# Patient Record
Sex: Female | Born: 1955
Health system: Southern US, Community
[De-identification: ages and names within clinical notes are randomized; demographics above are authoritative.]

## PROBLEM LIST (undated history)

## (undated) DIAGNOSIS — I10 Essential (primary) hypertension: Secondary | ICD-10-CM

## (undated) DIAGNOSIS — I2510 Atherosclerotic heart disease of native coronary artery without angina pectoris: Secondary | ICD-10-CM

## (undated) DIAGNOSIS — K759 Inflammatory liver disease, unspecified: Secondary | ICD-10-CM

## (undated) DIAGNOSIS — E039 Hypothyroidism, unspecified: Secondary | ICD-10-CM

## (undated) DIAGNOSIS — Z87442 Personal history of urinary calculi: Secondary | ICD-10-CM

## (undated) DIAGNOSIS — E785 Hyperlipidemia, unspecified: Secondary | ICD-10-CM

## (undated) DIAGNOSIS — M1990 Unspecified osteoarthritis, unspecified site: Secondary | ICD-10-CM

## (undated) HISTORY — DX: Essential (primary) hypertension: I10

## (undated) HISTORY — PX: TONSILLECTOMY: SUR1361

## (undated) HISTORY — DX: Hyperlipidemia, unspecified: E78.5

## (undated) HISTORY — DX: Atherosclerotic heart disease of native coronary artery without angina pectoris: I25.10

## (undated) HISTORY — PX: CARDIAC CATHETERIZATION: SHX172

## (undated) HISTORY — DX: Hypothyroidism, unspecified: E03.9

---

## 2011-05-26 ENCOUNTER — Encounter: Payer: Self-pay | Admitting: Cardiology

## 2011-06-02 ENCOUNTER — Encounter: Payer: Self-pay | Admitting: Cardiology

## 2011-06-26 ENCOUNTER — Encounter: Payer: Self-pay | Admitting: Cardiology

## 2011-07-07 ENCOUNTER — Encounter: Payer: Self-pay | Admitting: Cardiology

## 2011-07-11 ENCOUNTER — Encounter: Payer: Self-pay | Admitting: Cardiology

## 2012-02-19 ENCOUNTER — Encounter: Payer: Self-pay | Admitting: Cardiology

## 2012-02-19 ENCOUNTER — Encounter: Payer: Self-pay | Admitting: *Deleted

## 2012-02-20 ENCOUNTER — Encounter: Payer: Self-pay | Admitting: Cardiology

## 2012-02-20 ENCOUNTER — Ambulatory Visit (INDEPENDENT_AMBULATORY_CARE_PROVIDER_SITE_OTHER): Payer: PRIVATE HEALTH INSURANCE | Admitting: Cardiology

## 2012-02-20 VITALS — BP 137/70 | HR 87 | Ht 66.0 in | Wt 175.0 lb

## 2012-02-20 DIAGNOSIS — I2510 Atherosclerotic heart disease of native coronary artery without angina pectoris: Secondary | ICD-10-CM

## 2012-02-20 DIAGNOSIS — I10 Essential (primary) hypertension: Secondary | ICD-10-CM

## 2012-02-20 DIAGNOSIS — E785 Hyperlipidemia, unspecified: Secondary | ICD-10-CM

## 2012-02-20 DIAGNOSIS — I429 Cardiomyopathy, unspecified: Secondary | ICD-10-CM | POA: Insufficient documentation

## 2012-02-20 DIAGNOSIS — I428 Other cardiomyopathies: Secondary | ICD-10-CM

## 2012-02-20 MED ORDER — CARVEDILOL 6.25 MG PO TABS: 6.2500 mg | ORAL_TABLET | Freq: Two times a day (BID) | ORAL | Status: DC

## 2012-02-20 NOTE — Assessment & Plan Note (Signed)
Continue statin. Check lipids and liver. 

## 2012-02-20 NOTE — Assessment & Plan Note (Signed)
Continue present blood pressure medications. Check potassium and renal function. 

## 2012-02-20 NOTE — Assessment & Plan Note (Signed)
Continue aspirin and statin. 

## 2012-02-20 NOTE — Patient Instructions (Signed)
Your physician recommends that you schedule a follow-up appointment in: 3 MONTHS  Your physician recommends that you return for a FASTING lipid profile: WHEN ABLE AFTER 8:30AM  INCREASE CARVEDILOL TO 6.25 MG TWICE DAILY

## 2012-02-20 NOTE — Progress Notes (Signed)
HPI: 56 year old female for evaluation of coronary disease and cardiomyopathy. Patient recently moved here from [place]. Last fall she apparently developed palpitations with exertion. A nuclear study in August of 2012 showed a fixed anterior defect most likely related to breast artifact. Her ejection fraction was 42%. A Holter monitor showed no arrhythmia. An echocardiogram showed an ejection fraction of 35%, mild to moderate mitral regurgitation and mild left ventricular enlargement. Cardiac CTA in September of 2012 showed an ejection fraction of 45%, 80% LAD proximally, 80% mid LAD, 70% first diagonal, 70% ramus intermedius. Calcium score was 48. Cardiac catheterization was performed in October of 2012. Ejection fraction was 45% and there was a 60% ramus intermedius. There was a 50% proximal LAD. There was a 30% first diagonal. Fractional flow reserve on the LAD lesion was 0.92 and 0.91 on a ramus intermedius. Patient has not seen a physician since moving here. She denies dyspnea on exertion, orthopnea, PND, pedal edema, palpitations, syncope or exertional chest pain.  Current Outpatient Prescriptions  Medication Sig Dispense Refill  . ALPRAZolam (XANAX) 0.25 MG tablet Take 0.25 mg by mouth as needed.      Marland Kitchen aspirin 81 MG tablet Take 81 mg by mouth daily.      . carvedilol (COREG) 6.25 MG tablet Take 1 tablet (6.25 mg total) by mouth 2 (two) times daily with a meal.  60 tablet  12  . CRESTOR 10 MG tablet Take 10 mg by mouth daily.       . Iron-Folic Acid-B12-C-Docusate (FERRAPLUS 90 PO) Take 1 tablet by mouth 3 x daily with food.      Marland Kitchen levothyroxine (SYNTHROID, LEVOTHROID) 25 MCG tablet Take 25 mcg by mouth daily.       Marland Kitchen lisinopril (PRINIVIL,ZESTRIL) 5 MG tablet Take 2.5 mg by mouth daily.       Marland Kitchen spironolactone (ALDACTONE) 25 MG tablet Take 25 mg by mouth daily.       . Vitamin D, Ergocalciferol, (DRISDOL) 50000 UNITS CAPS Take 50,000 Units by mouth 2 (two) times a week.       Marland Kitchen  DISCONTD: carvedilol (COREG) 3.125 MG tablet Take 3.125 mg by mouth 2 (two) times daily with a meal.         Not on File  Past Medical History  Diagnosis Date  . Cardiomyopathy   . Hypertension   . Hyperlipidemia   . Hypothyroid   . CAD (coronary artery disease)   . Cardiomyopathy     Past Surgical History  Procedure Date  . Cardiac catheterization   . Tonsillectomy     History   Social History  . Marital Status: Married    Spouse Name: N/A    Number of Children: 1  . Years of Education: N/A   Occupational History  .      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 Smoking status: Former Smoker -- 0.3 packs/day for 5 years    Types: Cigarettes    Quit date: 02/19/1977  . Smokeless tobacco: Never Used  . Alcohol Use: Yes     Occasional  . Drug Use: Not on file  . Sexually Active: Not on file   Other Topics Concern  . Not on file   Social History Narrative  . No narrative on file    Family History  Problem Relation Age of Onset  . Hypertension Mother   . Heart disease Father     CHF  ROS:  no fevers or chills, productive cough, hemoptysis, dysphasia, odynophagia, melena, hematochezia, dysuria, hematuria, rash, seizure activity, orthopnea, PND, pedal edema, claudication. Remaining systems are negative.  Physical Exam:   Blood pressure 137/70, pulse 87, height 5\' 6"  (1.676 m), weight 79.379 kg (175 lb).  General:  Well developed/well nourished in NAD Skin warm/dry Patient not depressed No peripheral clubbing Back-normal HEENT-normal/normal eyelids Neck supple/normal carotid upstroke bilaterally; no bruits; no JVD; no thyromegaly chest - CTA/ normal expansion CV - RRR/normal S1 and S2; no murmurs, rubs or gallops;  PMI nondisplaced Abdomen -NT/ND, no HSM, no mass, + bowel sounds, no bruit 2+ femoral pulses, no bruits Ext-no edema, chords, 2+ DP Neuro-grossly nonfocal  ECG normal sinus rhythm at a rate of 93. Axis  normal. Left atrial enlargement. Prolonged QT interval. Nonspecific T-wave changes.

## 2012-02-20 NOTE — Assessment & Plan Note (Signed)
Patient has a nonischemic cardiomyopathy. Plan continue ACE inhibitor and beta blocker. Increase carvedilol to 6.25 mg by mouth twice a day. Titrate medications as tolerated. Repeat echocardiogram.

## 2012-02-29 ENCOUNTER — Other Ambulatory Visit (INDEPENDENT_AMBULATORY_CARE_PROVIDER_SITE_OTHER): Payer: PRIVATE HEALTH INSURANCE

## 2012-02-29 ENCOUNTER — Encounter: Payer: Self-pay | Admitting: *Deleted

## 2012-02-29 DIAGNOSIS — I2510 Atherosclerotic heart disease of native coronary artery without angina pectoris: Secondary | ICD-10-CM

## 2012-02-29 LAB — HEPATIC FUNCTION PANEL
ALT: 17 U/L (ref 0–35)
AST: 22 U/L (ref 0–37)
Alkaline Phosphatase: 65 U/L (ref 39–117)
Total Bilirubin: 0.8 mg/dL (ref 0.3–1.2)

## 2012-02-29 LAB — LIPID PANEL
HDL: 50.7 mg/dL (ref >39.00)
Total CHOL/HDL Ratio: 3
Triglycerides: 139 mg/dL (ref 0.0–149.0)

## 2012-04-10 ENCOUNTER — Telehealth: Payer: Self-pay | Admitting: Cardiology

## 2012-04-10 MED ORDER — SPIRONOLACTONE 25 MG PO TABS: 25.0000 mg | ORAL_TABLET | Freq: Every day | ORAL | Status: DC

## 2012-04-10 NOTE — Telephone Encounter (Signed)
New Problem:    Called needing a refill of her spironolactone (ALDACTONE) 25 MG tablet at the pharmacy listed on her profile (Phone [phone]).  Please call back once the order has been placed and feel free to leave a voicemail.

## 2012-04-12 ENCOUNTER — Other Ambulatory Visit: Payer: Self-pay | Admitting: Cardiology

## 2012-04-12 MED ORDER — SPIRONOLACTONE 25 MG PO TABS: 25.0000 mg | ORAL_TABLET | Freq: Every day | ORAL | Status: DC

## 2012-05-14 ENCOUNTER — Other Ambulatory Visit: Payer: Self-pay | Admitting: *Deleted

## 2012-05-14 MED ORDER — ROSUVASTATIN CALCIUM 10 MG PO TABS: 10.0000 mg | ORAL_TABLET | Freq: Every day | ORAL | Status: DC

## 2012-05-27 ENCOUNTER — Encounter: Payer: Self-pay | Admitting: Cardiology

## 2012-05-27 ENCOUNTER — Ambulatory Visit (INDEPENDENT_AMBULATORY_CARE_PROVIDER_SITE_OTHER): Payer: PRIVATE HEALTH INSURANCE | Admitting: Cardiology

## 2012-05-27 VITALS — BP 132/88 | HR 75 | Ht 66.0 in | Wt 181.0 lb

## 2012-05-27 DIAGNOSIS — E785 Hyperlipidemia, unspecified: Secondary | ICD-10-CM

## 2012-05-27 DIAGNOSIS — I2510 Atherosclerotic heart disease of native coronary artery without angina pectoris: Secondary | ICD-10-CM

## 2012-05-27 DIAGNOSIS — I10 Essential (primary) hypertension: Secondary | ICD-10-CM

## 2012-05-27 DIAGNOSIS — I429 Cardiomyopathy, unspecified: Secondary | ICD-10-CM

## 2012-05-27 DIAGNOSIS — I428 Other cardiomyopathies: Secondary | ICD-10-CM

## 2012-05-27 LAB — BASIC METABOLIC PANEL
BUN: 14 mg/dL (ref 6–23)
Calcium: 9.7 mg/dL (ref 8.4–10.5)
Creatinine, Ser: 0.8 mg/dL (ref 0.4–1.2)
GFR: 84.17 mL/min (ref >60.00)

## 2012-05-27 LAB — TSH: TSH: 2.81 u[IU]/mL (ref 0.35–5.50)

## 2012-05-27 NOTE — Assessment & Plan Note (Signed)
Continue ACE inhibitor and beta blocker. Check potassium and renal function. Check TSH. Repeat echocardiogram.

## 2012-05-27 NOTE — Assessment & Plan Note (Signed)
Blood pressure controlled. Continue present medications. 

## 2012-05-27 NOTE — Progress Notes (Signed)
HPI: Pleasant female I initially saw in May of 2013 for evaluation of coronary disease and cardiomyopathy. Previous studies in [facility] - nuclear study in 16-Jun-2011 showed a fixed anterior defect most likely related to breast artifact. Her ejection fraction was 42%. A Holter monitor showed no arrhythmia. An echocardiogram showed an ejection fraction of 35%, mild to moderate mitral regurgitation and mild left ventricular enlargement. Cardiac CTA in September of 2012 showed an ejection fraction of 45%, 80% LAD proximally, 80% mid LAD, 70% first diagonal, 70% ramus intermedius. Calcium score was 48. Cardiac catheterization was performed in October of 2012. Ejection fraction was 45% and there was a 60% ramus intermedius. There was a 50% proximal LAD. There was a 30% first diagonal. Fractional flow reserve on the LAD lesion was 0.92 and 0.91 on a ramus intermedius. Since she was last seen, the patient has dyspnea with more extreme activities but not with routine activities. It is relieved with rest. It is not associated with chest pain. There is no orthopnea, PND or pedal edema. There is no syncope or palpitations. There is no exertional chest pain.    Current Outpatient Prescriptions  Medication Sig Dispense Refill  . ALPRAZolam (XANAX) 0.25 MG tablet Take 0.25 mg by mouth as needed.      Marland Kitchen aspirin 81 MG tablet Take 81 mg by mouth daily.      . carvedilol (COREG) 6.25 MG tablet Take 1 tablet (6.25 mg total) by mouth 2 (two) times daily with a meal.  60 tablet  12  . Iron-Folic Acid-B12-C-Docusate (FERRAPLUS 90 PO) Take 1 tablet by mouth 3 x daily with food.      Marland Kitchen levothyroxine (SYNTHROID, LEVOTHROID) 25 MCG tablet Take 25 mcg by mouth daily.       Marland Kitchen lisinopril (PRINIVIL,ZESTRIL) 5 MG tablet Take 2.5 mg by mouth daily.       . rosuvastatin (CRESTOR) 10 MG tablet Take 1 tablet (10 mg total) by mouth daily.  30 tablet  12  . spironolactone (ALDACTONE) 25 MG tablet Take 1 tablet (25 mg total) by mouth  daily.  30 tablet  3  . Vitamin D, Ergocalciferol, (DRISDOL) 50000 UNITS CAPS Take 50,000 Units by mouth 2 (two) times a week.          Past Medical History  Diagnosis Date  . Cardiomyopathy   . Hypertension   . Hyperlipidemia   . Hypothyroid   . CAD (coronary artery disease)   . Cardiomyopathy     Past Surgical History  Procedure Date  . Cardiac catheterization   . Tonsillectomy     History   Social History  . Marital Status: Married    Spouse Name: N/A    Number of Children: 1  . Years of Education: N/A   Occupational History  .      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 Smoking status: Former Smoker -- 0.3 packs/day for 5 years    Types: Cigarettes    Quit date: 02/19/1977  . Smokeless tobacco: Never Used  . Alcohol Use: Yes     Occasional  . Drug Use: Not on file  . Sexually Active: Not on file   Other Topics Concern  . Not on file   Social History Narrative  . No narrative on file    ROS: no fevers or chills, productive cough, hemoptysis, dysphasia, odynophagia, melena, hematochezia, dysuria, hematuria, rash, seizure activity, orthopnea, PND, pedal edema, claudication. Remaining  systems are negative.  Physical Exam: Well-developed well-nourished in no acute distress.  Skin is warm and dry.  HEENT is normal.  Neck is supple.  Chest is clear to auscultation with normal expansion.  Cardiovascular exam is regular rate and rhythm.  Abdominal exam nontender or distended. No masses palpated. Extremities show no edema. neuro grossly intact  ECG normal sinus rhythm at a rate of 75. Nonspecific ST changes.

## 2012-05-27 NOTE — Assessment & Plan Note (Signed)
Continue statin. 

## 2012-05-27 NOTE — Patient Instructions (Addendum)
Your physician recommends that you continue on your current medications as directed. Please refer to the Current Medication list given to you today.  Your physician recommends that you have lab work TODAY:  [facility], [employer] [facility]  Your physician has requested that you have an echocardiogram. Echocardiography is a painless test that uses sound waves to create images of your heart. It provides your doctor with information about the size and shape of your heart and how well your heart's chambers and valves are working. This procedure takes approximately one hour. There are no restrictions for this procedure.   Your physician wants you to follow-up in:1 year you will receive a reminder letter in the mail two months in advance. If you don't receive a letter, please call our office to schedule the follow-up appointment.   You have been referred to gynecology

## 2012-05-27 NOTE — Assessment & Plan Note (Signed)
Continue aspirin and statin. 

## 2012-06-12 ENCOUNTER — Other Ambulatory Visit (HOSPITAL_COMMUNITY): Payer: PRIVATE HEALTH INSURANCE

## 2012-06-14 ENCOUNTER — Other Ambulatory Visit: Payer: Self-pay | Admitting: Cardiology

## 2012-06-14 MED ORDER — LISINOPRIL 5 MG PO TABS: 2.5000 mg | ORAL_TABLET | Freq: Every day | ORAL | Status: DC

## 2012-09-25 ENCOUNTER — Telehealth: Payer: Self-pay | Admitting: Cardiology

## 2012-09-25 NOTE — Telephone Encounter (Signed)
Left message for pt, will need to get vit d from PCP or whomever prescribed.

## 2012-09-25 NOTE — Telephone Encounter (Signed)
plz request refill on vitamin D medication, she can be reached at [phone].  She will be in and out of meetings all day today

## 2012-10-01 ENCOUNTER — Other Ambulatory Visit: Payer: Self-pay

## 2012-10-01 MED ORDER — LEVOTHYROXINE SODIUM 25 MCG PO TABS: 25.0000 ug | ORAL_TABLET | Freq: Every day | ORAL | Status: DC

## 2013-01-07 ENCOUNTER — Telehealth: Payer: Self-pay | Admitting: Cardiology

## 2013-01-07 NOTE — Telephone Encounter (Signed)
Spoke with pt, she reports yesterday she developed a tight and heavy feeling in her chest. She also states she was a little SOB. She feels better today and is under a lot of stress at work. She was doing her desk work when it developed. It was off and on through out the day. After work she walked 1 1/2 miles with no problems. The discomfort did not change with exertion. She has been having some reflux and indigestion type symptoms. She did take tums during the night and it did help relieve the discomfort. Explained to pt that it sounded more like stomach than heart. She will try liquid mylanta and zantac for her symptoms tonight and will discuss with dr Jens Som.

## 2013-01-07 NOTE — Telephone Encounter (Signed)
Follow-up:    Patient returned your call.  Please call back. 

## 2013-01-07 NOTE — Telephone Encounter (Signed)
Left message for pt to call.

## 2013-01-07 NOTE — Telephone Encounter (Signed)
New problem    C/O tightness in chest x 2 days. Sob. Has a Rx for nitro from her previous cardiologist - [facility].

## 2013-01-08 NOTE — Telephone Encounter (Signed)
Left message for pt to call, if feeling the same will need f/u appt

## 2013-01-17 ENCOUNTER — Other Ambulatory Visit: Payer: Self-pay | Admitting: *Deleted

## 2013-01-17 MED ORDER — LISINOPRIL 5 MG PO TABS: 2.5000 mg | ORAL_TABLET | Freq: Every day | ORAL | Status: DC

## 2013-04-18 ENCOUNTER — Other Ambulatory Visit: Payer: Self-pay | Admitting: *Deleted

## 2013-04-18 DIAGNOSIS — I2510 Atherosclerotic heart disease of native coronary artery without angina pectoris: Secondary | ICD-10-CM

## 2013-04-18 MED ORDER — CARVEDILOL 6.25 MG PO TABS: 6.2500 mg | ORAL_TABLET | Freq: Two times a day (BID) | ORAL | Status: DC

## 2013-04-29 ENCOUNTER — Other Ambulatory Visit: Payer: Self-pay | Admitting: *Deleted

## 2013-04-29 MED ORDER — LEVOTHYROXINE SODIUM 25 MCG PO TABS: 25.0000 ug | ORAL_TABLET | Freq: Every day | ORAL | Status: DC

## 2013-05-01 ENCOUNTER — Other Ambulatory Visit: Payer: Self-pay | Admitting: *Deleted

## 2013-05-13 ENCOUNTER — Other Ambulatory Visit: Payer: Self-pay

## 2013-05-13 MED ORDER — SPIRONOLACTONE 25 MG PO TABS: 25.0000 mg | ORAL_TABLET | Freq: Every day | ORAL | Status: DC

## 2013-05-13 NOTE — Telephone Encounter (Signed)
spironolactone (ALDACTONE) 25 MG tablet  Take 1 tablet (25 mg total) by mouth daily.   30 tablet   Patient Instructions  Your physician recommends that you continue on your current medications as directed. Please refer to the Current Medication list given to you today.    Lewayne Bunting, MD at 05/27/2012  9:22 AM

## 2013-06-02 ENCOUNTER — Other Ambulatory Visit: Payer: Self-pay

## 2013-06-02 MED ORDER — ROSUVASTATIN CALCIUM 10 MG PO TABS: 10.0000 mg | ORAL_TABLET | Freq: Every day | ORAL | Status: DC

## 2013-06-11 ENCOUNTER — Ambulatory Visit: Payer: PRIVATE HEALTH INSURANCE | Admitting: Cardiology

## 2013-07-10 ENCOUNTER — Ambulatory Visit: Payer: PRIVATE HEALTH INSURANCE | Admitting: Cardiology

## 2013-07-29 ENCOUNTER — Other Ambulatory Visit: Payer: Self-pay

## 2013-07-29 MED ORDER — SPIRONOLACTONE 25 MG PO TABS: 25.0000 mg | ORAL_TABLET | Freq: Every day | ORAL | Status: DC

## 2013-08-04 ENCOUNTER — Encounter: Payer: Self-pay | Admitting: Cardiovascular Disease

## 2013-08-11 ENCOUNTER — Ambulatory Visit: Payer: PRIVATE HEALTH INSURANCE | Admitting: Cardiovascular Disease

## 2013-09-02 ENCOUNTER — Encounter: Payer: Self-pay | Admitting: Cardiovascular Disease

## 2013-09-02 ENCOUNTER — Ambulatory Visit (INDEPENDENT_AMBULATORY_CARE_PROVIDER_SITE_OTHER): Payer: BC Managed Care – PPO | Admitting: Cardiovascular Disease

## 2013-09-02 VITALS — BP 147/86 | HR 78 | Ht 65.5 in | Wt 187.0 lb

## 2013-09-02 DIAGNOSIS — I2510 Atherosclerotic heart disease of native coronary artery without angina pectoris: Secondary | ICD-10-CM

## 2013-09-02 DIAGNOSIS — E785 Hyperlipidemia, unspecified: Secondary | ICD-10-CM

## 2013-09-02 DIAGNOSIS — Z79899 Other long term (current) drug therapy: Secondary | ICD-10-CM

## 2013-09-02 NOTE — Assessment & Plan Note (Signed)
CTA of arteries in [facility] should calcium score of 48 with three-vessel disease. She simply had cardiac catheterization in September 2012 revealing noncritical CAD. FFR was performed on the LAD lesion which was 0.9 to as was the ramus lesion. She does have moderate left ventricular dysfunction with an EF of 45% range. She is asymptomatic on appropriate medications.

## 2013-09-02 NOTE — Progress Notes (Signed)
09/02/2013 Erin Mccann   11-Apr-1956  [id number]  Primary Physician No PCP Per Patient Primary Cardiologist: Runell Gess MD [name]   HPI:  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 She is self-referred to be established in my practice for ongoing vascular care. She did see Dr. [name] a year ago. Previous studies in [facility] - nuclear study in Jun 05, 2011 showed a fixed anterior defect most likely related to breast artifact. Her ejection fraction was 42%. A Holter monitor showed no arrhythmia. An echocardiogram showed an ejection fraction of 35%, mild to moderate mitral regurgitation and mild left ventricular enlargement. Cardiac CTA in September of 2012 showed an ejection fraction of 45%, 80% LAD proximally, 80% mid LAD, 70% first diagonal, 70% ramus intermedius. Calcium score was 48. Cardiac catheterization was performed in October of 2012. Ejection fraction was 45% and there was a 60% ramus intermedius. There was a 50% proximal LAD. There was a 30% first diagonal. Fractional flow reserve on the LAD lesion was 0.92 and 0.91 on a ramus intermedius. Since she was last seen, the patient has dyspnea with more extreme activities but not with routine activities. It is relieved with rest. It is not associated with chest pain. There is no orthopnea, PND or pedal edema. There is no syncope or palpitations. There is no exertional chest pain. Her other problems include hypertension and dyslipidemia.   Current Outpatient Prescriptions  Medication Sig Dispense Refill  . ALPRAZolam (XANAX) 0.25 MG tablet Take 0.25 mg by mouth as needed.      Marland Kitchen aspirin 81 MG tablet Take 81 mg by mouth daily.      . carvedilol (COREG) 3.125 MG tablet Take 3.125 mg by mouth daily.       . Iron-Folic Acid-B12-C-Docusate (FERRAPLUS 90 PO) Take 1 tablet by mouth 3 x daily with food.      Marland Kitchen levothyroxine  (SYNTHROID, LEVOTHROID) 25 MCG tablet Take 1 tablet (25 mcg total) by mouth daily.  90 tablet  4  . lisinopril (PRINIVIL,ZESTRIL) 5 MG tablet Take 0.5 tablets (2.5 mg total) by mouth daily.  30 tablet  12  . rosuvastatin (CRESTOR) 10 MG tablet Take 1 tablet (10 mg total) by mouth daily.  30 tablet  4  . spironolactone (ALDACTONE) 25 MG tablet Take 1 tablet (25 mg total) by mouth daily.  30 tablet  1  . Vitamin D, Ergocalciferol, (DRISDOL) 50000 UNITS CAPS Take 50,000 Units by mouth 2 (two) times a week.        No current facility-administered medications for this visit.    Allergies  Allergen Reactions  . Codeine Nausea Only    History   Social History  . Marital Status: Married    Spouse Name: N/A    Number of Children: 1  . Years of Education: N/A   Occupational History  .      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 Smoking status: Former Smoker -- 0.30 packs/day for 5 years    Types: Cigarettes    Quit date: 02/19/1977  . Smokeless tobacco: Never Used  . Alcohol Use: Yes     Comment: Occasional  . Drug Use: Not on file  . Sexual Activity: Not on file   Other Topics Concern  . Not on file   Social History Narrative  . No narrative on file  Review of Systems: General: negative for chills, fever, night sweats or weight changes.  Cardiovascular: negative for chest pain, dyspnea on exertion, edema, orthopnea, palpitations, paroxysmal nocturnal dyspnea or shortness of breath Dermatological: negative for rash Respiratory: negative for cough or wheezing Urologic: negative for hematuria Abdominal: negative for nausea, vomiting, diarrhea, bright red blood per rectum, melena, or hematemesis Neurologic: negative for visual changes, syncope, or dizziness All other systems reviewed and are otherwise negative except as noted above.    Blood pressure 147/86, pulse 78, height 5' 5.5" (1.664 m), weight 187 lb (84.823 kg).  General appearance:  cooperative and no distress Neck: no adenopathy, no carotid bruit, no JVD, supple, symmetrical, trachea midline and thyroid not enlarged, symmetric, no tenderness/mass/nodules Lungs: clear to auscultation bilaterally Heart: regular rate and rhythm, S1, S2 normal, no murmur, click, rub or gallop Abdomen: soft, non-tender; bowel sounds normal; no masses,  no organomegaly Extremities: extremities normal, atraumatic, no cyanosis or edema and 2+ pedal pulses  EKG normal sinus rhythm at 78 with nonspecific ST and T-wave changes  ASSESSMENT AND PLAN:   CAD (coronary artery disease) CTA of arteries in [facility] should calcium score of 48 with three-vessel disease. She simply had cardiac catheterization in September 2012 revealing noncritical CAD. FFR was performed on the LAD lesion which was 0.9 to as was the ramus lesion. She does have moderate left ventricular dysfunction with an EF of 45% range. She is asymptomatic on appropriate medications.  Hyperlipidemia On statin therapy. We will recheck a lipid and liver profile  Hypertension Under good control on current medications      Runell Gess MD [facility], [facility] 09/02/2013 3:50 PM

## 2013-09-02 NOTE — Assessment & Plan Note (Signed)
Under good control on current medications 

## 2013-09-02 NOTE — Assessment & Plan Note (Signed)
On statin therapy. We will recheck a lipid and liver profile 

## 2013-09-02 NOTE — Patient Instructions (Signed)
  We will see you back in follow up in 1 year with Dr Berry  Dr Berry has ordered blood work to be done, fasting   

## 2013-10-13 ENCOUNTER — Other Ambulatory Visit: Payer: Self-pay | Admitting: *Deleted

## 2013-10-13 MED ORDER — SPIRONOLACTONE 25 MG PO TABS: 25.0000 mg | ORAL_TABLET | Freq: Every day | ORAL | Status: DC

## 2013-10-29 ENCOUNTER — Telehealth: Payer: Self-pay | Admitting: Cardiovascular Disease

## 2013-10-29 NOTE — Telephone Encounter (Signed)
Please call-started a weight program after Christmas.She is very concerned because she started losing weight and now she is at a standstill. She was wondering if some of the medicine she is on might prevent her from losing weight or cause her to maintain fluid. She want you to review her  Medicine and let her know please.

## 2013-10-29 NOTE — Telephone Encounter (Signed)
Left message call was returned. 

## 2013-10-30 NOTE — Telephone Encounter (Signed)
Returning a call from yesterday from Dr Hazle CocaBerry's nurse.

## 2013-10-30 NOTE — Telephone Encounter (Signed)
Returned call and pt verified x 2.  Pt stated she started a weight loss program since her OV.  Pt wanted to know if any of her meds may be causing her to not lose weight.  Pt informed it would not be abnormal for carvedilol to cause swelling and that she is also on a diuretic.  Pt advised to f/u with PCP to make sure thyroid is normal as well.  Pt verbalized understanding and agreed w/ plan.

## 2013-12-01 ENCOUNTER — Other Ambulatory Visit: Payer: Self-pay | Admitting: Cardiology

## 2013-12-03 ENCOUNTER — Telehealth: Payer: Self-pay | Admitting: Cardiovascular Disease

## 2013-12-03 MED ORDER — ROSUVASTATIN CALCIUM 10 MG PO TABS: 10.0000 mg | ORAL_TABLET | Freq: Every day | ORAL | Status: DC

## 2013-12-03 NOTE — Telephone Encounter (Signed)
Been trying to get a refill on her Crestor.Would you please call it to [facility].Would you call today

## 2013-12-03 NOTE — Telephone Encounter (Signed)
Rx was sent to pharmacy electronically. Called patient to inform of refill and remind that Dr Allyson SabalBerry had ordered a lipid/liver and she should have this blood work. Verbalized understanding

## 2013-12-05 ENCOUNTER — Other Ambulatory Visit: Payer: Self-pay

## 2013-12-05 MED ORDER — ROSUVASTATIN CALCIUM 10 MG PO TABS: 10.0000 mg | ORAL_TABLET | Freq: Every day | ORAL | Status: DC

## 2013-12-05 NOTE — Telephone Encounter (Signed)
Rx was sent to pharmacy electronically. 

## 2013-12-12 ENCOUNTER — Other Ambulatory Visit: Payer: Self-pay | Admitting: *Deleted

## 2013-12-12 ENCOUNTER — Telehealth: Payer: Self-pay | Admitting: Cardiovascular Disease

## 2013-12-12 DIAGNOSIS — Z79899 Other long term (current) drug therapy: Secondary | ICD-10-CM

## 2013-12-12 DIAGNOSIS — E782 Mixed hyperlipidemia: Secondary | ICD-10-CM

## 2013-12-12 MED ORDER — SPIRONOLACTONE 25 MG PO TABS: 25.0000 mg | ORAL_TABLET | Freq: Every day | ORAL | Status: DC

## 2013-12-12 NOTE — Telephone Encounter (Signed)
Rx was sent to pharmacy electronically. 

## 2013-12-12 NOTE — Telephone Encounter (Signed)
Please call,concerning having her lab work done.

## 2013-12-15 NOTE — Telephone Encounter (Signed)
Returned call and pt verified x 2.  Pt stated she was seen in October or November and was supposed to have blood work done.  Stated she thinks it was her lipids.  Pt informed that is correct and she should be fasting when she goes to the lab.  Pt also asked what her weight was at last OV b/c she has started a weight loss program.  Weight given, 187 lbs.  Pt verbalized understanding and agreed w/ plan.  Labs reordered for [facility] and pt will [facility] to [facility] to have drawn.

## 2013-12-18 LAB — LIPID PANEL
Cholesterol: 134 mg/dL (ref 0–200)
HDL: 46 mg/dL (ref >39)
LDL Cholesterol: 61 mg/dL (ref 0–99)
Total CHOL/HDL Ratio: 2.9 Ratio
Triglycerides: 136 mg/dL (ref <150)
VLDL: 27 mg/dL (ref 0–40)

## 2013-12-19 LAB — HEPATIC FUNCTION PANEL
ALBUMIN: 4.3 g/dL (ref 3.5–5.2)
ALK PHOS: 65 U/L (ref 39–117)
ALT: 15 U/L (ref 0–35)
AST: 16 U/L (ref 0–37)
Bilirubin, Direct: 0.1 mg/dL (ref 0.0–0.3)
Indirect Bilirubin: 0.4 mg/dL (ref 0.2–1.2)
TOTAL PROTEIN: 6.7 g/dL (ref 6.0–8.3)
Total Bilirubin: 0.5 mg/dL (ref 0.2–1.2)

## 2013-12-22 ENCOUNTER — Encounter: Payer: Self-pay | Admitting: *Deleted

## 2014-03-22 ENCOUNTER — Other Ambulatory Visit: Payer: Self-pay | Admitting: Cardiology

## 2014-03-23 NOTE — Telephone Encounter (Signed)
Rx was sent to pharmacy electronically. 

## 2014-04-06 ENCOUNTER — Other Ambulatory Visit: Payer: Self-pay | Admitting: *Deleted

## 2014-04-06 ENCOUNTER — Telehealth: Payer: Self-pay | Admitting: Cardiovascular Disease

## 2014-04-06 DIAGNOSIS — Z79899 Other long term (current) drug therapy: Secondary | ICD-10-CM

## 2014-04-06 DIAGNOSIS — E785 Hyperlipidemia, unspecified: Secondary | ICD-10-CM

## 2014-04-06 MED ORDER — ATORVASTATIN CALCIUM 20 MG PO TABS: 20.0000 mg | ORAL_TABLET | Freq: Every day | ORAL | Status: DC

## 2014-04-06 NOTE — Telephone Encounter (Signed)
Please call ,she would like to take another cholesterol medicine that is less expensive.

## 2014-04-06 NOTE — Telephone Encounter (Signed)
Atorvastatin prescription and lab slips mailed to patient's home address on file.

## 2014-04-06 NOTE — Telephone Encounter (Signed)
Left message informing patient of Dr. Hazle CocaBerry's decision.

## 2014-04-06 NOTE — Telephone Encounter (Signed)
Change to atorvastatin 20 mg daily and recheck lipid and liver in 6-8 weeks

## 2014-04-06 NOTE — Telephone Encounter (Signed)
Spoke with patient in reference to her cholesterol medication. She would like to change her Crestor to atorvastatin or simvastatin due to $$. I informed the patient that usually if a medication is working for a patient then the doctors sometimes prefer for them to continue on their therapy. All statins will not work on everyone. Patient agrees and still would like for me to ask Dr. Allyson SabalBerry if he will consider changing her prescription. Message will be forwarded for his review and recommendations.

## 2014-04-28 ENCOUNTER — Emergency Department (HOSPITAL_COMMUNITY)
Admission: EM | Admit: 2014-04-28 | Discharge: 2014-04-28 | Disposition: A | Discharge status: Home / Self Care | Payer: BC Managed Care – PPO | Attending: Emergency Medicine | Admitting: Emergency Medicine

## 2014-04-28 ENCOUNTER — Emergency Department (HOSPITAL_COMMUNITY): Payer: BC Managed Care – PPO

## 2014-04-28 ENCOUNTER — Encounter (HOSPITAL_COMMUNITY): Payer: Self-pay | Admitting: Emergency Medicine

## 2014-04-28 DIAGNOSIS — Z7982 Long term (current) use of aspirin: Secondary | ICD-10-CM | POA: Insufficient documentation

## 2014-04-28 DIAGNOSIS — Z87891 Personal history of nicotine dependence: Secondary | ICD-10-CM | POA: Insufficient documentation

## 2014-04-28 DIAGNOSIS — J209 Acute bronchitis, unspecified: Secondary | ICD-10-CM | POA: Insufficient documentation

## 2014-04-28 DIAGNOSIS — J208 Acute bronchitis due to other specified organisms: Secondary | ICD-10-CM

## 2014-04-28 DIAGNOSIS — E039 Hypothyroidism, unspecified: Secondary | ICD-10-CM | POA: Insufficient documentation

## 2014-04-28 DIAGNOSIS — I2510 Atherosclerotic heart disease of native coronary artery without angina pectoris: Secondary | ICD-10-CM | POA: Insufficient documentation

## 2014-04-28 DIAGNOSIS — Z9889 Other specified postprocedural states: Secondary | ICD-10-CM | POA: Insufficient documentation

## 2014-04-28 DIAGNOSIS — E785 Hyperlipidemia, unspecified: Secondary | ICD-10-CM | POA: Insufficient documentation

## 2014-04-28 DIAGNOSIS — Z792 Long term (current) use of antibiotics: Secondary | ICD-10-CM | POA: Insufficient documentation

## 2014-04-28 DIAGNOSIS — Z79899 Other long term (current) drug therapy: Secondary | ICD-10-CM | POA: Insufficient documentation

## 2014-04-28 DIAGNOSIS — I10 Essential (primary) hypertension: Secondary | ICD-10-CM | POA: Insufficient documentation

## 2014-04-28 LAB — BASIC METABOLIC PANEL
Anion gap: 12 (ref 5–15)
BUN: 13 mg/dL (ref 6–23)
CALCIUM: 9.4 mg/dL (ref 8.4–10.5)
CO2: 26 mEq/L (ref 19–32)
Chloride: 104 mEq/L (ref 96–112)
Creatinine, Ser: 0.92 mg/dL (ref 0.50–1.10)
GFR calc Af Amer: 79 mL/min — ABNORMAL LOW (ref >90)
GFR, EST NON AFRICAN AMERICAN: 68 mL/min — AB (ref >90)
GLUCOSE: 84 mg/dL (ref 70–99)
Potassium: 3.9 mEq/L (ref 3.7–5.3)
SODIUM: 142 meq/L (ref 137–147)

## 2014-04-28 LAB — CBC
HCT: 42 % (ref 36.0–46.0)
HEMOGLOBIN: 13.7 g/dL (ref 12.0–15.0)
MCH: 30.5 pg (ref 26.0–34.0)
MCHC: 32.6 g/dL (ref 30.0–36.0)
MCV: 93.5 fL (ref 78.0–100.0)
PLATELETS: 244 10*3/uL (ref 150–400)
RBC: 4.49 MIL/uL (ref 3.87–5.11)
RDW: 12.8 % (ref 11.5–15.5)
WBC: 6.5 10*3/uL (ref 4.0–10.5)

## 2014-04-28 LAB — I-STAT TROPONIN, ED: Troponin i, poc: 0 ng/mL (ref 0.00–0.08)

## 2014-04-28 LAB — PRO B NATRIURETIC PEPTIDE: PRO B NATRI PEPTIDE: 154.8 pg/mL — AB (ref 0–125)

## 2014-04-28 MED ORDER — PREDNISONE 10 MG PO TABS: 60.0000 mg | ORAL_TABLET | Freq: Every day | ORAL | Status: DC

## 2014-04-28 MED ORDER — ALBUTEROL SULFATE HFA 108 (90 BASE) MCG/ACT IN AERS: 2.0000 | INHALATION_SPRAY | RESPIRATORY_TRACT | Status: DC | PRN

## 2014-04-28 NOTE — ED Notes (Signed)
Pt being treated for sinusitis and sts she may have pna now, hx of chf and has tightness in chest when she breathes and coughs

## 2014-04-28 NOTE — Discharge Instructions (Signed)
Bronchitis  Bronchitis is inflammation of the airways that extend from the windpipe into the lungs (bronchi). The inflammation often causes mucus to develop, which leads to a cough. If the inflammation becomes severe, it may cause shortness of breath.  CAUSES   Bronchitis may be caused by:    Viral infections.    Bacteria.    Cigarette smoke.    Allergens, pollutants, and other irritants.   SIGNS AND SYMPTOMS   The most common symptom of bronchitis is a frequent cough that produces mucus. Other symptoms include:   Fever.    Body aches.    Chest congestion.    Chills.    Shortness of breath.    Sore throat.   DIAGNOSIS   Bronchitis is usually diagnosed through a medical history and physical exam. Tests, such as chest X-rays, are sometimes done to rule out other conditions.   TREATMENT   You may need to avoid contact with whatever caused the problem (smoking, for example). Medicines are sometimes needed. These may include:   Antibiotics. These may be prescribed if the condition is caused by bacteria.   Cough suppressants. These may be prescribed for relief of cough symptoms.    Inhaled medicines. These may be prescribed to help open your airways and make it easier for you to breathe.    Steroid medicines. These may be prescribed for those with recurrent (chronic) bronchitis.  HOME CARE INSTRUCTIONS   Get plenty of rest.    Drink enough fluids to keep your urine clear or pale yellow (unless you have a medical condition that requires fluid restriction). Increasing fluids may help thin your secretions and will prevent dehydration.    Only take over-the-counter or prescription medicines as directed by your health care provider.   Only take antibiotics as directed. Make sure you finish them even if you start to feel better.   Avoid secondhand smoke, irritating chemicals, and strong fumes. These will make bronchitis worse. If you are a smoker, quit smoking. Consider using nicotine gum or  skin patches to help control withdrawal symptoms. Quitting smoking will help your lungs heal faster.    Put a cool-mist humidifier in your bedroom at night to moisten the air. This may help loosen mucus. Change the water in the humidifier daily. You can also run the hot water in your shower and sit in the bathroom with the door closed for 5-10 minutes.    Follow up with your health care provider as directed.    Wash your hands frequently to avoid catching bronchitis again or spreading an infection to others.   SEEK MEDICAL CARE IF:  Your symptoms do not improve after 1 week of treatment.   SEEK IMMEDIATE MEDICAL CARE IF:   Your fever increases.   You have chills.    You have chest pain.    You have worsening shortness of breath.    You have bloody sputum.   You faint.   You have lightheadedness.   You have a severe headache.    You vomit repeatedly.  MAKE SURE YOU:    Understand these instructions.   Will watch your condition.   Will get help right away if you are not doing well or get worse.  Document Released: 09/25/2005 Document Revised: 07/16/2013 Document Reviewed: 05/20/2013  Exit[employer] Patient Information 2015 [employer]. This information is not intended to replace advice given to you by your health care provider. Make sure you discuss any questions you have with your health care   provider.

## 2014-04-28 NOTE — ED Notes (Signed)
Pt to ED c/o chest tightness, worse x 4 days. [facility] and prednisone x 2 weeks without relief.  Reports has been diagnosed with sinus infections, has not been getting better with medications.  Reports tightness in throat and chest, Pt sts "I thought it was from drainage but it doesn't go away." Pt has hx of CHF. No edema noted.

## 2014-04-28 NOTE — ED Provider Notes (Signed)
I saw and evaluated the patient, reviewed the resident's note and I agree with the findings and plan.   EKG Interpretation   Date/Time:  Tuesday April 28 2014 13:32:32 EDT Ventricular Rate:  82 PR Interval:  160 QRS Duration: 74 QT Interval:  378 QTC Calculation: 441 R Axis:   65 Text Interpretation:  Normal sinus rhythm Possible Left atrial enlargement  Borderline ECG Confirmed by Dorien Mayotte, MD, Valetta Mulroy ([id number]) on 04/28/2014  4:33:18 PM       Pt comes in with cc of chest pain. Cough. Concerned she might have a pna, as she is being treated for sinusitis. No cardiac concerns. CXR is clear. Already done with prednisone. Suspect viral syndrome/bronchitis. Will give albuterol prn.   Derwood KaplanAnkit Yoselyn Mcglade, MD 04/28/14 [phone]

## 2014-04-28 NOTE — ED Provider Notes (Signed)
CSN: [id number]     Arrival date & time 04/28/14  1324 History   First MD Initiated Contact with Patient 04/28/14 1601     Chief Complaint  Patient presents with  . Recurrent Sinusitis     (Consider location/radiation/quality/duration/timing/severity/associated sxs/prior Treatment) Patient is a 58 y.o. female presenting with shortness of breath.  Shortness of Breath Severity:  Mild Onset quality:  Gradual Duration:  2 weeks Progression:  Improving Chronicity:  New Context: URI   Relieved by:  None tried Associated symptoms: no abdominal pain, no chest pain, no cough, no fever, no headaches, no rash, no sore throat and no vomiting     Past Medical History  Diagnosis Date  . Cardiomyopathy   . Hypertension   . Hyperlipidemia   . Hypothyroid   . CAD (coronary artery disease)   . Cardiomyopathy    Past Surgical History  Procedure Laterality Date  . Cardiac catheterization    . Tonsillectomy     Family History  Problem Relation Age of Onset  . Hypertension Mother   . Heart disease Father     CHF   History  Substance Use Topics  . Smoking status: Former Smoker -- 0.30 packs/day for 5 years    Types: Cigarettes    Quit date: 02/19/1977  . Smokeless tobacco: Never Used  . Alcohol Use: Yes     Comment: Occasional   OB History   Grav Para Term Preterm Abortions TAB SAB Ect Mult Living                 Review of Systems  Constitutional: Negative for fever and chills.  HENT: Negative for sore throat.   Eyes: Negative for pain.  Respiratory: Positive for shortness of breath. Negative for cough.   Cardiovascular: Negative for chest pain.  Gastrointestinal: Negative for nausea, vomiting, abdominal pain and diarrhea.  Genitourinary: Negative for dysuria.  Musculoskeletal: Negative for back pain.  Skin: Negative for rash.  Neurological: Negative for numbness and headaches.      Allergies  Codeine  Home Medications   Prior to Admission medications    Medication Sig Start Date End Date Taking? Authorizing Provider  ALPRAZolam (XANAX) 0.25 MG tablet Take 0.125 mg by mouth daily as needed for anxiety.    Yes Historical Provider, MD  amoxicillin-clavulanate (AUGMENTIN) 875-125 MG per tablet Take 1 tablet by mouth 2 (two) times daily. 10 day course started 04/24/14 04/24/14  Yes Historical Provider, MD  aspirin EC 81 MG tablet Take 81 mg by mouth daily with lunch.   Yes Historical Provider, MD  carvedilol (COREG) 3.125 MG tablet Take 3.125 mg by mouth 2 (two) times daily with a meal. At lunch and at bedtime   Yes Historical Provider, MD  Iron-Folic Acid-B12-C-Docusate (FERRAPLUS 90 PO) Take 1 tablet by mouth daily with lunch.    Yes Historical Provider, MD  levothyroxine (SYNTHROID, LEVOTHROID) 25 MCG tablet Take 1 tablet (25 mcg total) by mouth daily. 04/29/13  Yes Lewayne Bunting, MD  lisinopril (PRINIVIL,ZESTRIL) 5 MG tablet Take 2.5 mg by mouth daily with lunch.   Yes Historical Provider, MD  metroNIDAZOLE (METROGEL) 1 % gel Apply 1 application topically at bedtime. Apply to face for rosacea   Yes Historical Provider, MD  Multiple Vitamin (MULTIVITAMIN WITH MINERALS) TABS tablet Take 1 tablet by mouth daily with lunch.   Yes Historical Provider, MD  Omega-3 Fatty Acids (FISH OIL PO) Take 1 capsule by mouth daily with lunch.   Yes Historical Provider, MD  rosuvastatin (CRESTOR) 10 MG tablet Take 10 mg by mouth daily.   Yes Historical Provider, MD  spironolactone (ALDACTONE) 25 MG tablet Take 25 mg by mouth daily with lunch.   Yes Historical Provider, MD  Sulfacetamide Sodium-Sulfur (AVAR LS CLEANSER) 10-2 % LIQD Apply 1 application topically at bedtime. Apply to face for rosacea   Yes Historical Provider, MD  Vitamin D, Ergocalciferol, (DRISDOL) 50000 UNITS CAPS Take 50,000 Units by mouth 2 (two) times a week. Tuesdays and Saturdays 01/29/12  Yes Historical Provider, MD  albuterol (PROVENTIL HFA;VENTOLIN HFA) 108 (90 BASE) MCG/ACT inhaler Inhale 2  puffs into the lungs every 4 (four) hours as needed for shortness of breath. 04/28/14   Imagene ShellerSteve Tyreonna Czaplicki, MD  doxycycline (VIBRAMYCIN) 100 MG capsule Take 100 mg by mouth 2 (two) times daily. 10 day course completed 04/24/14 04/15/14   Historical Provider, MD  predniSONE (DELTASONE) 10 MG tablet Take 6 tablets (60 mg total) by mouth daily. 04/28/14   Imagene ShellerSteve Omar Orrego, MD  predniSONE (DELTASONE) 50 MG tablet Take 50 mg by mouth daily with breakfast. 3 day course completed 04/26/2014 04/24/14   Historical Provider, MD   BP 125/65  Pulse 78  Temp(Src) 97.6 F (36.4 C) (Oral)  Resp 18  Ht 5\' 6"  (1.676 m)  Wt 170 lb (77.111 kg)  BMI 27.45 kg/m2  SpO2 100% Physical Exam  Constitutional: She is oriented to person, place, and time. She appears well-developed and well-nourished. No distress.  HENT:  Head: Normocephalic and atraumatic.  Eyes: Pupils are equal, round, and reactive to light. Right eye exhibits no discharge. Left eye exhibits no discharge.  Neck: Normal range of motion.  Cardiovascular: Normal rate, regular rhythm and normal heart sounds.   Pulmonary/Chest: Effort normal and breath sounds normal. She has no decreased breath sounds. She has no wheezes.  Abdominal: Soft. She exhibits no distension. There is no tenderness.  Musculoskeletal: Normal range of motion.  Neurological: She is alert and oriented to person, place, and time.  Skin: Skin is warm. She is not diaphoretic.    ED Course  Procedures (including critical care time) Labs Review Labs Reviewed  BASIC METABOLIC PANEL - Abnormal; Notable for the following:    GFR calc non Af Amer 68 (*)    GFR calc Af Amer 79 (*)    All other components within normal limits  PRO B NATRIURETIC PEPTIDE - Abnormal; Notable for the following:    Pro B Natriuretic peptide (BNP) 154.8 (*)    All other components within normal limits  CBC  I-STAT TROPOININ, ED    Imaging Review Dg Chest 2 View  04/28/2014   CLINICAL DATA:  Chest tightness  EXAM:  CHEST  2 VIEW  COMPARISON:  None.  FINDINGS: The heart size and mediastinal contours are within normal limits. Both lungs are clear. The visualized skeletal structures are unremarkable.  IMPRESSION: No active cardiopulmonary disease.   Electronically Signed   By: Maryclare BeanArt  Hoss M.D.   On: 04/28/2014 15:53     EKG Interpretation   Date/Time:  Tuesday April 28 2014 13:32:32 EDT Ventricular Rate:  82 PR Interval:  160 QRS Duration: 74 QT Interval:  378 QTC Calculation: 441 R Axis:   65 Text Interpretation:  Normal sinus rhythm Possible Left atrial enlargement  Borderline ECG Confirmed by Rhunette CroftNANAVATI, MD, Janey GentaANKIT [id number]) on 04/28/2014  4:33:18 PM      MDM   Final diagnoses:  Viral bronchitis   58 yo F with hx of HTN, HLD, CAD, cardiomyopathy  presents with shortness of breath, recently treated for bronchitis with amoxicillin, steroids. Had sinusitis, with decreased symptoms, but still with cough and congestion.   Patient well-appearing on arrival. HDS, in NAD. Doubt ACS, PNA, PE. CXR with no acute findings. Lungs CTAB. Patient with no tachypnea. Will treat for bronchitis with albuterol for symptomatic relief, and short course of steroids, and recommend follow-up with PCP. Discussed non-antibiotic usage in a likely viral process. Patient comfortable with plan, discharged in good condition. Patient seen and evaluated by myself and my attending, Dr. Rhunette Croft. Strict return precautions given.      Imagene Sheller, MD 04/29/14 2005

## 2014-04-30 NOTE — ED Provider Notes (Signed)
I saw and evaluated the patient, reviewed the resident's note and I agree with the findings and plan.   EKG Interpretation   Date/Time:  Tuesday April 28 2014 13:32:32 EDT Ventricular Rate:  82 PR Interval:  160 QRS Duration: 74 QT Interval:  378 QTC Calculation: 441 R Axis:   65 Text Interpretation:  Normal sinus rhythm Possible Left atrial enlargement  Borderline ECG Confirmed by Huckleberry Martinson, MD, Berniece Abid ([id number]) on 04/28/2014  4:33:18 PM      Pt comes in with cc of pleuritic type chest pain. CXR is clear. Suspect bronchitis - and will tx accordingly. She is on amoxi already for sinusitis.  Derwood KaplanAnkit Lorenia Hoston, MD 04/30/14 0210

## 2014-05-14 ENCOUNTER — Other Ambulatory Visit: Payer: Self-pay | Admitting: Cardiology

## 2014-05-14 ENCOUNTER — Other Ambulatory Visit: Payer: Self-pay | Admitting: Cardiovascular Disease

## 2014-05-14 MED ORDER — CARVEDILOL 3.125 MG PO TABS: 3.1250 mg | ORAL_TABLET | Freq: Two times a day (BID) | ORAL | Status: DC

## 2014-05-14 NOTE — Telephone Encounter (Signed)
Rx was sent to pharmacy electronically. 

## 2014-05-15 NOTE — Telephone Encounter (Signed)
Rx was sent to pharmacy electronically. 

## 2014-08-19 LAB — HEPATIC FUNCTION PANEL
ALT: 17 U/L (ref 0–35)
AST: 17 U/L (ref 0–37)
Albumin: 4 g/dL (ref 3.5–5.2)
Alkaline Phosphatase: 78 U/L (ref 39–117)
BILIRUBIN DIRECT: 0.1 mg/dL (ref 0.0–0.3)
Indirect Bilirubin: 0.3 mg/dL (ref 0.2–1.2)
Total Bilirubin: 0.4 mg/dL (ref 0.2–1.2)
Total Protein: 6.4 g/dL (ref 6.0–8.3)

## 2014-08-19 LAB — LIPID PANEL
CHOL/HDL RATIO: 2.6 ratio
Cholesterol: 146 mg/dL (ref 0–200)
HDL: 57 mg/dL (ref >39)
LDL CALC: 68 mg/dL (ref 0–99)
Triglycerides: 103 mg/dL (ref <150)
VLDL: 21 mg/dL (ref 0–40)

## 2014-08-24 ENCOUNTER — Encounter: Payer: Self-pay | Admitting: *Deleted

## 2014-08-28 ENCOUNTER — Encounter: Payer: Self-pay | Admitting: Cardiovascular Disease

## 2014-08-28 ENCOUNTER — Ambulatory Visit (INDEPENDENT_AMBULATORY_CARE_PROVIDER_SITE_OTHER): Payer: BC Managed Care – PPO | Admitting: Cardiovascular Disease

## 2014-08-28 VITALS — BP 130/88 | HR 68 | Ht 65.5 in | Wt 177.9 lb

## 2014-08-28 DIAGNOSIS — I2510 Atherosclerotic heart disease of native coronary artery without angina pectoris: Secondary | ICD-10-CM

## 2014-08-28 DIAGNOSIS — I10 Essential (primary) hypertension: Secondary | ICD-10-CM

## 2014-08-28 DIAGNOSIS — E785 Hyperlipidemia, unspecified: Secondary | ICD-10-CM

## 2014-08-28 NOTE — Progress Notes (Signed)
08/28/2014 Erin Mccann   06/29/1956  [id number]  Primary Physician No PCP Per Patient Primary Cardiologist: Runell GessJonathan J. Berry MD Roseanne RenoFACP,FACC,FAHA, FSCAI   HPI:  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 She was self-referred to be established in my practice for ongoing vascular care. She did see Dr. Olga MillersBrian Crenshaw in the past. I last saw her one year ago. [facility] showed a fixed anterior defect most likely related to breast artifact. Her ejection fraction was 42%. A Holter monitor showed no arrhythmia. An echocardiogram showed an ejection fraction of 35%, mild to moderate mitral regurgitation and mild left ventricular enlargement. Cardiac CTA in September of 2012 showed an ejection fraction of 45%, 80% LAD proximally, 80% mid LAD, 70% first diagonal, 70% ramus intermedius. Calcium score was 48. Cardiac catheterization was performed in October of 2012. Ejection fraction was 45% and there was a 60% ramus intermedius. There was a 50% proximal LAD. There was a 30% first diagonal. Fractional flow reserve on the LAD lesion was 0.92 and 0.91 on a ramus intermedius. Since she was last seen, the patient has dyspnea with more extreme activities but not with routine activities. It is relieved with rest. It is not associated with chest pain. There is no orthopnea, PND or pedal edema. There is no syncope or palpitations. There is no exertional chest pain. Her other problems include hypertension and dyslipidemia.   Current Outpatient Prescriptions  Medication Sig Dispense Refill  . albuterol (PROVENTIL HFA;VENTOLIN HFA) 108 (90 BASE) MCG/ACT inhaler Inhale 2 puffs into the lungs every 4 (four) hours as needed for shortness of breath. 1 Inhaler 0  . ALPRAZolam (XANAX) 0.25 MG tablet Take 0.125 mg by mouth daily as needed for anxiety.     Erin Mccann. aspirin EC 81 MG tablet Take 81  mg by mouth daily with lunch.    Erin Mccann. atorvastatin (LIPITOR) 20 MG tablet   5  . Calcium Carbonate-Vit D-Min (CALCIUM 1200) 1200-1000 MG-UNIT CHEW Chew 2 tablets by mouth daily.    . carvedilol (COREG) 3.125 MG tablet Take 1 tablet (3.125 mg total) by mouth 2 (two) times daily with a meal. 60 tablet 3  . Iron-Folic Acid-B12-C-Docusate (FERRAPLUS 90 PO) Take 1 tablet by mouth daily with lunch.     . levothyroxine (SYNTHROID, LEVOTHROID) 25 MCG tablet Take 1 tablet (25 mcg total) by mouth daily. 90 tablet 4  . lisinopril (PRINIVIL,ZESTRIL) 5 MG tablet Take 2.5 mg by mouth daily with lunch.    . metroNIDAZOLE (METROGEL) 1 % gel Apply 1 application topically at bedtime. Apply to face for rosacea    . Omega-3 Fatty Acids (FISH OIL PO) Take 1 capsule by mouth daily with lunch.    . spironolactone (ALDACTONE) 25 MG tablet Take 25 mg by mouth daily with lunch.    . Sulfacetamide Sodium-Sulfur (AVAR LS CLEANSER) 10-2 % LIQD Apply 1 application topically at bedtime. Apply to face for rosacea    . Vitamin D, Ergocalciferol, (DRISDOL) 50000 UNITS CAPS Take 50,000 Units by mouth 2 (two) times a week. Tuesdays and Saturdays     No current facility-administered medications for this visit.    Allergies  Allergen Reactions  . Codeine Nausea Only    History   Social History  . Marital Status: Married    Spouse Name: N/A    Number of Children: 1  . Years  of Education: N/A   Occupational History  .      Associate ProfessorDirector [employer]   Social History Main Topics  . Smoking status: Former Smoker -- 0.30 packs/day for 5 years    Types: Cigarettes    Quit date: 02/19/1977  . Smokeless tobacco: Never Used  . Alcohol Use: Yes     Comment: Occasional  . Drug Use: Not on file  . Sexual Activity: Not on file   Other Topics Concern  . Not on file   Social History Narrative     Review of Systems: General: negative for chills, fever, night sweats or weight changes.  Cardiovascular: negative for  chest pain, dyspnea on exertion, edema, orthopnea, palpitations, paroxysmal nocturnal dyspnea or shortness of breath Dermatological: negative for rash Respiratory: negative for cough or wheezing Urologic: negative for hematuria Abdominal: negative for nausea, vomiting, diarrhea, bright red blood per rectum, melena, or hematemesis Neurologic: negative for visual changes, syncope, or dizziness All other systems reviewed and are otherwise negative except as noted above.    Blood pressure 130/88, pulse 68, height 5' 5.5" (1.664 m), weight 177 lb 14.4 oz (80.695 kg).  General appearance: alert and no distress Neck: no adenopathy, no carotid bruit, no JVD, supple, symmetrical, trachea midline and thyroid not enlarged, symmetric, no tenderness/mass/nodules Lungs: clear to auscultation bilaterally Heart: regular rate and rhythm, S1, S2 normal, no murmur, click, rub or gallop Extremities: extremities normal, atraumatic, no cyanosis or edema  EKG normal sinus rhythm at 68 without ST or T-wave changes. I personally reviewed this EKG  ASSESSMENT AND PLAN:   CAD (coronary artery disease) History of noncritical CAD with last cardiac catheterization performed 07/2011 with 50% proximal LAD, 30% diagonal branch and ramus branch stenoses all of which were found to be nonsignificant by FFR. The patient denies chest pain or shortness of breath.  Hypertension History of hypertension with blood pressure measured today 130/88 on carvedilol and lisinopril. Continue current medications at current dosing  Hyperlipidemia History of hyperlipidemia on atorvastatin 20 mg daily recent lipid profile performed 08/19/14 revealed total cholesterol 146, LDL 68 and HDL of 57.      Runell GessJonathan J. Berry MD [facility]ACP,FACC,FAHA, [facility] 08/28/2014 5:21 PM

## 2014-08-28 NOTE — Assessment & Plan Note (Signed)
History of hyperlipidemia on atorvastatin 20 mg daily recent lipid profile performed 08/19/14 revealed total cholesterol 146, LDL 68 and HDL of 57.

## 2014-08-28 NOTE — Assessment & Plan Note (Signed)
History of noncritical CAD with last cardiac catheterization performed 07/2011 with 50% proximal LAD, 30% diagonal branch and ramus branch stenoses all of which were found to be nonsignificant by FFR. The patient denies chest pain or shortness of breath.

## 2014-08-28 NOTE — Assessment & Plan Note (Signed)
History of hypertension with blood pressure measured today 130/88 on carvedilol and lisinopril. Continue current medications at current dosing

## 2014-08-28 NOTE — Patient Instructions (Signed)
Your physician wants you to follow-up in 1 year with Dr. Berry. You will receive a reminder letter in the mail 2 months in advance. If you do not receive a letter, please call our office to schedule the follow-up appointment.  

## 2014-09-07 ENCOUNTER — Other Ambulatory Visit: Payer: Self-pay | Admitting: Cardiology

## 2014-09-07 ENCOUNTER — Other Ambulatory Visit: Payer: Self-pay | Admitting: Cardiovascular Disease

## 2014-09-10 NOTE — Telephone Encounter (Signed)
Rx denied to pharmacy, note sent to pharmacy to forward to PCP

## 2014-09-11 ENCOUNTER — Other Ambulatory Visit: Payer: Self-pay | Admitting: Cardiology

## 2014-09-28 ENCOUNTER — Other Ambulatory Visit: Payer: Self-pay

## 2014-09-28 MED ORDER — LISINOPRIL 5 MG PO TABS: 2.5000 mg | ORAL_TABLET | Freq: Every day | ORAL | Status: DC

## 2014-09-28 NOTE — Telephone Encounter (Signed)
Rx sent to pharmacy   

## 2014-11-16 ENCOUNTER — Other Ambulatory Visit: Payer: Self-pay

## 2014-11-16 MED ORDER — SPIRONOLACTONE 25 MG PO TABS
25.0000 mg | ORAL_TABLET | Freq: Every day | ORAL | Status: DC
Start: 2014-11-16 — End: 2015-11-13

## 2014-11-16 MED ORDER — CARVEDILOL 3.125 MG PO TABS: 3.1250 mg | ORAL_TABLET | Freq: Two times a day (BID) | ORAL | Status: DC

## 2014-11-16 NOTE — Telephone Encounter (Signed)
Rx(s) sent to pharmacy electronically.  

## 2014-12-10 ENCOUNTER — Other Ambulatory Visit: Payer: Self-pay | Admitting: Cardiovascular Disease

## 2014-12-10 NOTE — Telephone Encounter (Signed)
Rx(s) sent to pharmacy electronically.  

## 2015-08-31 ENCOUNTER — Encounter: Payer: Self-pay | Admitting: Cardiovascular Disease

## 2015-08-31 ENCOUNTER — Ambulatory Visit (INDEPENDENT_AMBULATORY_CARE_PROVIDER_SITE_OTHER): Payer: BLUE CROSS/BLUE SHIELD | Admitting: Cardiovascular Disease

## 2015-08-31 VITALS — BP 144/92 | HR 69 | Ht 66.0 in

## 2015-08-31 DIAGNOSIS — I10 Essential (primary) hypertension: Secondary | ICD-10-CM | POA: Diagnosis not present

## 2015-08-31 DIAGNOSIS — I2510 Atherosclerotic heart disease of native coronary artery without angina pectoris: Secondary | ICD-10-CM | POA: Diagnosis not present

## 2015-08-31 DIAGNOSIS — E785 Hyperlipidemia, unspecified: Secondary | ICD-10-CM | POA: Diagnosis not present

## 2015-08-31 DIAGNOSIS — I2583 Coronary atherosclerosis due to lipid rich plaque: Secondary | ICD-10-CM

## 2015-08-31 NOTE — Progress Notes (Signed)
08/31/2015 Erin Mccann   09/12/1956  [id number]  Primary Physician No PCP Per Patient Primary Cardiologist: Runell GessJonathan J. Kongmeng Santoro MD Roseanne RenoFACP,FACC,FAHA, FSCAI   HPI:  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 She was self-referred to be established in my practice for ongoing vascular care. She did see Dr. Olga MillersBrian Crenshaw in the past. I last saw her one year ago. [facility] - nuclear study in August of 2012 showed a fixed anterior defect most likely related to breast artifact. Her ejection fraction was 42%. A Holter monitor showed no arrhythmia. An echocardiogram showed an ejection fraction of 35%, mild to moderate mitral regurgitation and mild left ventricular enlargement. Cardiac CTA in September of 2012 showed an ejection fraction of 45%, 80% LAD proximally, 80% mid LAD, 70% first diagonal, 70% ramus intermedius. Calcium score was 48. Cardiac catheterization was performed in October of 2012. Ejection fraction was 45% and there was a 60% ramus intermedius. There was a 50% proximal LAD. There was a 30% first diagonal. Fractional flow reserve on the LAD lesion was 0.92 and 0.91 on a ramus intermedius. Her other problems include hypertension and hyperlipidemia. She denies chest pain or shortness of breath.     Current Outpatient Prescriptions  Medication Sig Dispense Refill  . ALPRAZolam (XANAX) 0.25 MG tablet Take 0.125 mg by mouth daily as needed for anxiety.     Marland Kitchen. aspirin EC 81 MG tablet Take 81 mg by mouth daily with lunch.    Marland Kitchen. atorvastatin (LIPITOR) 20 MG tablet Take 1 tablet (20 mg total) by mouth daily. 30 tablet 8  . Calcium Carbonate-Vit D-Min (CALCIUM 1200) 1200-1000 MG-UNIT CHEW Chew 2 tablets by mouth daily.    . carvedilol (COREG) 3.125 MG tablet Take 1 tablet (3.125 mg total) by mouth 2 (two) times daily with a meal. 60 tablet 9  . Iron-Folic  Acid-B12-C-Docusate (FERRAPLUS 90 PO) Take 1 tablet by mouth daily with lunch.     . levothyroxine (SYNTHROID, LEVOTHROID) 25 MCG tablet TAKE 1 TABLET EACH DAY. 90 tablet 1  . lisinopril (PRINIVIL,ZESTRIL) 5 MG tablet Take 0.5 tablets (2.5 mg total) by mouth daily with lunch. 15 tablet 10  . metroNIDAZOLE (METROGEL) 1 % gel Apply 1 application topically at bedtime. Apply to face for rosacea    . Omega-3 Fatty Acids (FISH OIL PO) Take 1 capsule by mouth daily with lunch.    . spironolactone (ALDACTONE) 25 MG tablet Take 1 tablet (25 mg total) by mouth daily with lunch. 30 tablet 9  . Sulfacetamide Sodium-Sulfur (AVAR LS CLEANSER) 10-2 % LIQD Apply 1 application topically at bedtime. Apply to face for rosacea    . Vitamin D, Ergocalciferol, (DRISDOL) 50000 UNITS CAPS Take 50,000 Units by mouth 2 (two) times a week. Tuesdays and Saturdays     No current facility-administered medications for this visit.    Allergies  Allergen Reactions  . Codeine Nausea Only    Social History   Social History  . Marital Status: Married    Spouse Name: N/A  . Number of Children: 1  . Years of Education: N/A   Occupational History  .      Associate ProfessorDirector Human Resources [employer]   Social History Main Topics  . Smoking status: Former Smoker -- 0.30 packs/day for 5 years    Types: Cigarettes    Quit date: 02/19/1977  .  Smokeless tobacco: Never Used  . Alcohol Use: Yes     Comment: Occasional  . Drug Use: Not on file  . Sexual Activity: Not on file   Other Topics Concern  . Not on file   Social History Narrative     Review of Systems: General: negative for chills, fever, night sweats or weight changes.  Cardiovascular: negative for chest pain, dyspnea on exertion, edema, orthopnea, palpitations, paroxysmal nocturnal dyspnea or shortness of breath Dermatological: negative for rash Respiratory: negative for cough or wheezing Urologic: negative for hematuria Abdominal: negative for nausea, vomiting,  diarrhea, bright red blood per rectum, melena, or hematemesis Neurologic: negative for visual changes, syncope, or dizziness All other systems reviewed and are otherwise negative except as noted above.    Blood pressure 144/92, pulse 69, height  (1.676 m).  General appearance: alert and no distress Neck: no adenopathy, no carotid bruit, no JVD, supple, symmetrical, trachea midline and thyroid not enlarged, symmetric, no tenderness/mass/nodules Lungs: clear to auscultation bilaterally Heart: regular rate and rhythm, S1, S2 normal, no murmur, click, rub or gallop Extremities: extremities normal, atraumatic, no cyanosis or edema  EKG normal sinus rhythm at 69 without ST or T-wave changes. There were septal Q waves. I personally reviewed this EKG  ASSESSMENT AND PLAN:   Hypertension History of hypertension 144/92. She is on carvedilol, spironolactone and lisinopril. Continue current meds at current dosing  Hyperlipidemia History of hyperlipidemia on atorvastatin 20 mg a day. We'll recheck a lipid and liver profile  CAD (coronary artery disease) History of noncritical CAD by cath 07/11/11 and [place]. She had FFR of her LAD and ramus branch  Which were 0.9-1.91 respectively. She was treated medically. She denies chest pain or shortness of breath. Her ejection fraction at that time was 45%.      Runell Gess MD FACP,FACC,FAHA, [facility] 08/31/2015 4:52 PM

## 2015-08-31 NOTE — Assessment & Plan Note (Signed)
History of hyperlipidemia on atorvastatin 20 mg a day. We'll recheck a lipid and liver profile

## 2015-08-31 NOTE — Patient Instructions (Signed)
Medication Instructions:  Your physician recommends that you continue on your current medications as directed. Please refer to the Current Medication list given to you today.   Labwork: Your physician recommends that you return for lab work in: FASTING Lipid/Liver   Testing/Procedures: none  Follow-Up: Your physician wants you to follow-up in: 12 months with Dr. Allyson SabalBerry. You will receive a reminder letter in the mail two months in advance. If you don't receive a letter, please call our office to schedule the follow-up appointment.   Any Other Special Instructions Will Be Listed Below (If Applicable).     If you need a refill on your cardiac medications before your next appointment, please call your pharmacy.

## 2015-08-31 NOTE — Assessment & Plan Note (Signed)
History of hypertension 144/92. She is on carvedilol, spironolactone and lisinopril. Continue current meds at current dosing

## 2015-08-31 NOTE — Assessment & Plan Note (Signed)
History of noncritical CAD by cath 07/11/11 and [place]. She had FFR of her LAD and ramus branch  Which were 0.9-1.91 respectively. She was treated medically. She denies chest pain or shortness of breath. Her ejection fraction at that time was 45%.

## 2015-09-17 ENCOUNTER — Telehealth: Payer: Self-pay | Admitting: Cardiovascular Disease

## 2015-09-17 NOTE — Telephone Encounter (Signed)
Advice communicated to patient, who verbalized understanding. 

## 2015-09-17 NOTE — Telephone Encounter (Signed)
Please call,,she said she would discuss with you what she needs.

## 2015-09-17 NOTE — Telephone Encounter (Signed)
Returned call to patient.  She is having a procedure for cosmetic injectables in her mouth and cheeks on 12/28. She cites easy bruising when this was done 4 years ago, wants to know if she can discontinue her aspirin & fish oil for 1 week.  Pt informed I would defer to Dr. Allyson SabalBerry to advise.

## 2015-09-17 NOTE — Telephone Encounter (Signed)
yes

## 2015-09-22 ENCOUNTER — Other Ambulatory Visit: Payer: Self-pay | Admitting: Cardiovascular Disease

## 2015-09-22 NOTE — Telephone Encounter (Signed)
Rx has been sent to the pharmacy electronically. ° °

## 2015-10-26 IMAGING — CR DG CHEST 2V
2 series · 2 of 2 positions shown · non-contrast
Comparison: None.

CLINICAL DATA: Chest tightness

EXAM:
CHEST  2 VIEW

[w chest pa]
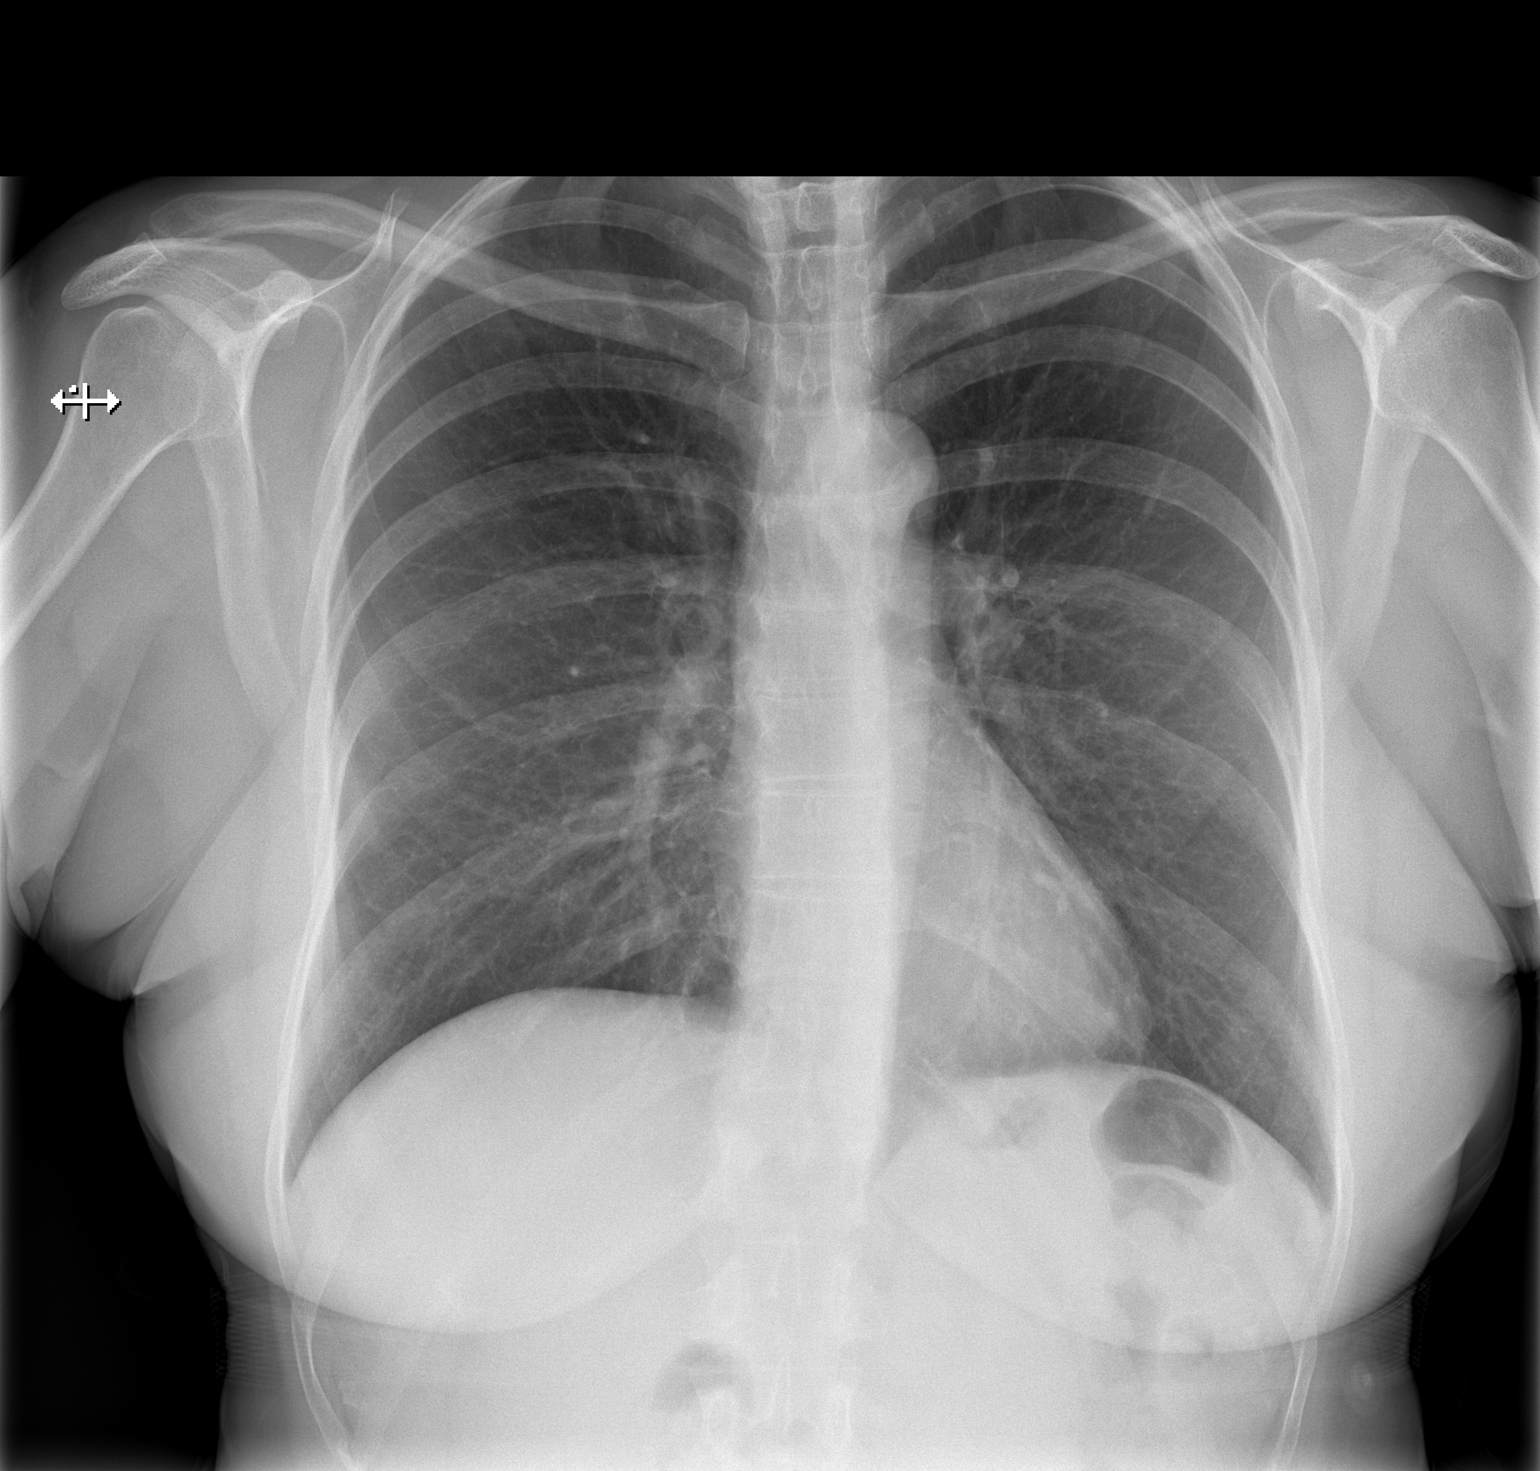

[w chest lat]
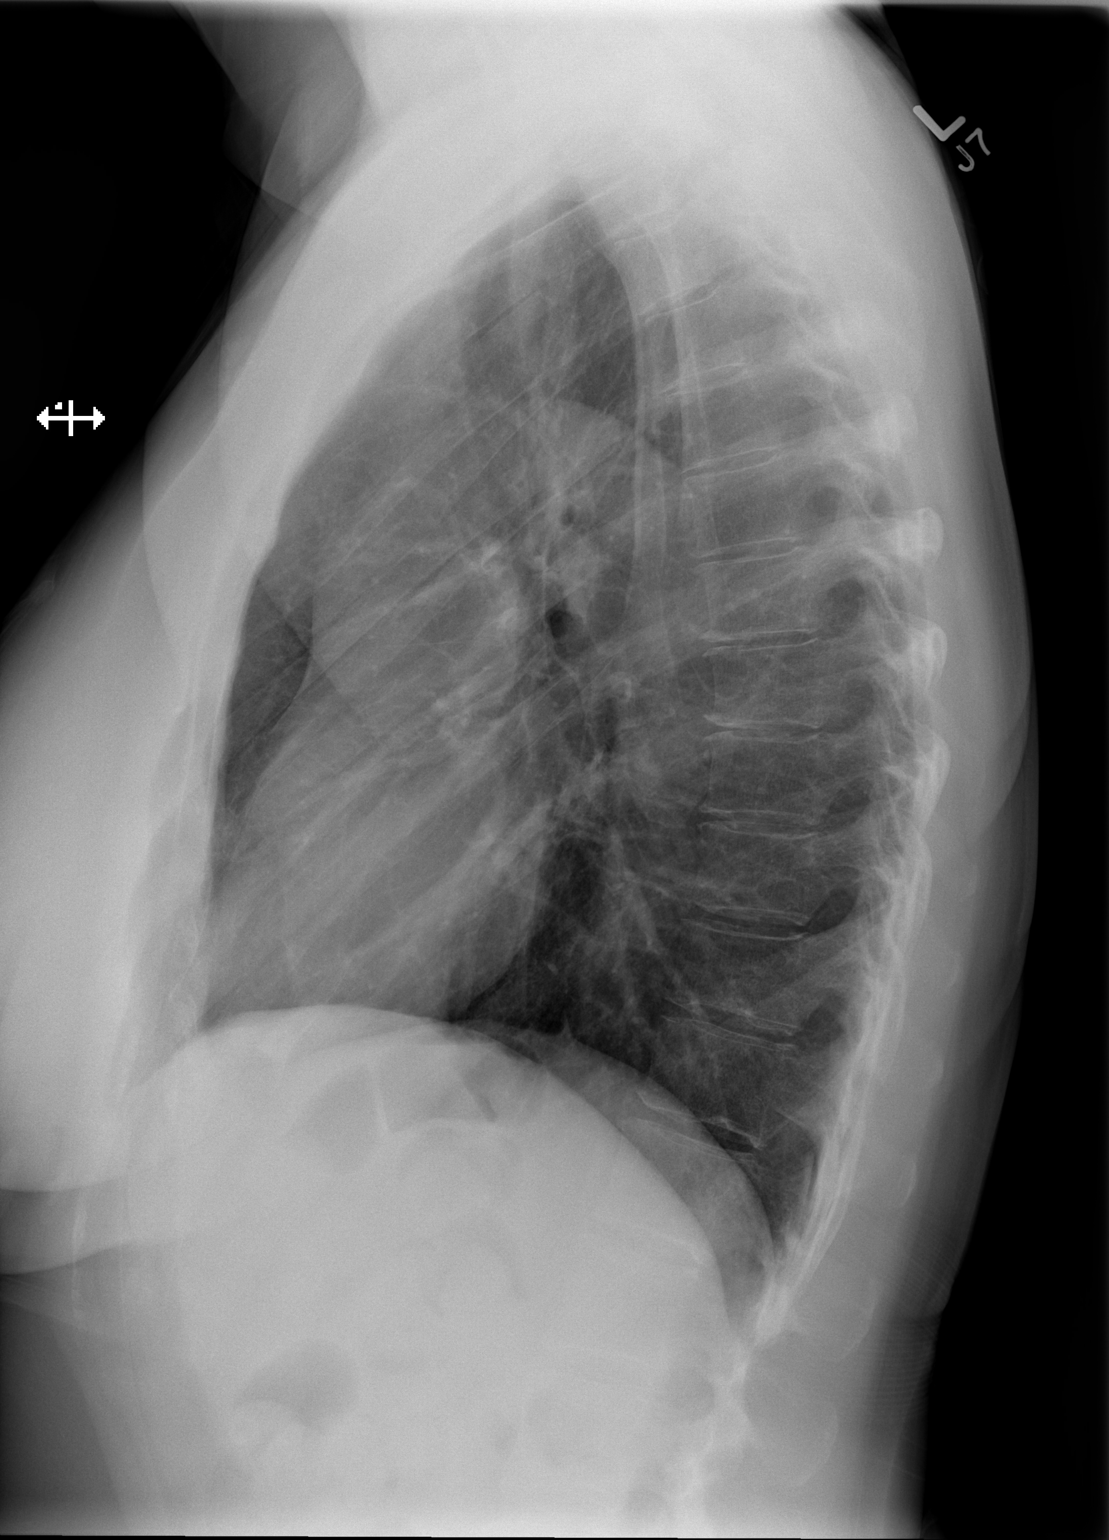

[2 of 2 positions shown; findings below may reference images not displayed]

FINDINGS: The heart size and mediastinal contours are within normal limits.
Both lungs are clear. The visualized skeletal structures are
unremarkable.
IMPRESSION: No active cardiopulmonary disease.

## 2015-11-13 ENCOUNTER — Other Ambulatory Visit: Payer: Self-pay | Admitting: Cardiovascular Disease

## 2015-11-15 NOTE — Telephone Encounter (Signed)
Rx(s) sent to pharmacy electronically.  

## 2015-11-20 ENCOUNTER — Other Ambulatory Visit: Payer: Self-pay | Admitting: Cardiovascular Disease

## 2015-11-22 NOTE — Telephone Encounter (Signed)
Rx(s) sent to pharmacy electronically.  

## 2016-07-09 ENCOUNTER — Other Ambulatory Visit: Payer: Self-pay | Admitting: Cardiovascular Disease

## 2016-07-10 NOTE — Telephone Encounter (Signed)
Rx request sent to pharmacy.  

## 2016-07-18 ENCOUNTER — Other Ambulatory Visit: Payer: Self-pay | Admitting: Cardiovascular Disease

## 2016-08-24 ENCOUNTER — Other Ambulatory Visit: Payer: Self-pay | Admitting: Cardiovascular Disease

## 2016-08-25 NOTE — Telephone Encounter (Signed)
Rx(s) sent to pharmacy electronically.  

## 2016-09-13 ENCOUNTER — Other Ambulatory Visit: Payer: Self-pay | Admitting: Cardiovascular Disease

## 2016-11-24 ENCOUNTER — Other Ambulatory Visit: Payer: Self-pay | Admitting: Cardiovascular Disease

## 2016-11-24 NOTE — Telephone Encounter (Signed)
REFILL 

## 2016-11-28 ENCOUNTER — Other Ambulatory Visit: Payer: Self-pay | Admitting: Cardiovascular Disease

## 2017-01-03 ENCOUNTER — Other Ambulatory Visit: Payer: Self-pay | Admitting: Cardiovascular Disease

## 2017-01-11 ENCOUNTER — Other Ambulatory Visit: Payer: Self-pay | Admitting: Cardiovascular Disease

## 2017-01-11 NOTE — Telephone Encounter (Signed)
REFILL0 

## 2017-02-16 ENCOUNTER — Other Ambulatory Visit: Payer: Self-pay | Admitting: Cardiovascular Disease

## 2017-02-16 NOTE — Telephone Encounter (Signed)
REFILL 

## 2017-09-02 ENCOUNTER — Other Ambulatory Visit: Payer: Self-pay | Admitting: Cardiovascular Disease

## 2019-11-23 HISTORY — PX: OTHER SURGICAL HISTORY: SHX169

## 2020-04-14 HISTORY — PX: BREAST REDUCTION SURGERY: SHX8

## 2021-01-31 DIAGNOSIS — Z6831 Body mass index (BMI) 31.0-31.9, adult: Secondary | ICD-10-CM | POA: Diagnosis not present

## 2021-01-31 DIAGNOSIS — Z01419 Encounter for gynecological examination (general) (routine) without abnormal findings: Secondary | ICD-10-CM | POA: Diagnosis not present

## 2021-02-03 DIAGNOSIS — H01119 Allergic dermatitis of unspecified eye, unspecified eyelid: Secondary | ICD-10-CM | POA: Diagnosis not present

## 2021-02-03 DIAGNOSIS — L920 Granuloma annulare: Secondary | ICD-10-CM | POA: Diagnosis not present

## 2021-02-03 LAB — HM PAP SMEAR: HM Pap smear: NEGATIVE

## 2021-02-10 DIAGNOSIS — Z13820 Encounter for screening for osteoporosis: Secondary | ICD-10-CM | POA: Diagnosis not present

## 2021-04-04 DIAGNOSIS — L239 Allergic contact dermatitis, unspecified cause: Secondary | ICD-10-CM | POA: Diagnosis not present

## 2021-04-08 DIAGNOSIS — L259 Unspecified contact dermatitis, unspecified cause: Secondary | ICD-10-CM | POA: Diagnosis not present

## 2021-04-08 DIAGNOSIS — L920 Granuloma annulare: Secondary | ICD-10-CM | POA: Diagnosis not present

## 2021-04-08 DIAGNOSIS — H01119 Allergic dermatitis of unspecified eye, unspecified eyelid: Secondary | ICD-10-CM | POA: Diagnosis not present

## 2021-04-08 DIAGNOSIS — Z5181 Encounter for therapeutic drug level monitoring: Secondary | ICD-10-CM | POA: Diagnosis not present

## 2021-04-08 LAB — HEPATIC FUNCTION PANEL
ALT: 16 (ref 7–35)
AST: 21 (ref 13–35)
Alkaline Phosphatase: 68 (ref 25–125)
Bilirubin, Total: 0.7

## 2021-04-08 LAB — CBC: RBC: 3.62 — AB (ref 3.87–5.11)

## 2021-04-08 LAB — BASIC METABOLIC PANEL
BUN: 23 — AB (ref 4–21)
CO2: 22 (ref 13–22)
Chloride: 103 (ref 99–108)
Creatinine: 1 (ref 0.5–1.1)
Glucose: 82
Potassium: 4.6 (ref 3.4–5.3)
Sodium: 135 — AB (ref 137–147)

## 2021-04-08 LAB — CBC AND DIFFERENTIAL
HCT: 35 — AB (ref 36–46)
Hemoglobin: 11 — AB (ref 12.0–16.0)
Neutrophils Absolute: 3024
Platelets: 193 (ref 150–399)
WBC: 4.8

## 2021-04-08 LAB — COMPREHENSIVE METABOLIC PANEL
Albumin: 3.9 (ref 3.5–5.0)
Calcium: 9.2 (ref 8.7–10.7)
Globulin: 2.2

## 2021-05-05 ENCOUNTER — Ambulatory Visit: Payer: BLUE CROSS/BLUE SHIELD | Admitting: Internal Medicine

## 2021-05-16 ENCOUNTER — Encounter: Payer: Self-pay | Admitting: Internal Medicine

## 2021-05-16 ENCOUNTER — Ambulatory Visit (INDEPENDENT_AMBULATORY_CARE_PROVIDER_SITE_OTHER): Payer: BC Managed Care – PPO | Admitting: Internal Medicine

## 2021-05-16 ENCOUNTER — Other Ambulatory Visit: Payer: Self-pay

## 2021-05-16 DIAGNOSIS — R197 Diarrhea, unspecified: Secondary | ICD-10-CM

## 2021-05-16 DIAGNOSIS — E782 Mixed hyperlipidemia: Secondary | ICD-10-CM

## 2021-05-16 DIAGNOSIS — I10 Essential (primary) hypertension: Secondary | ICD-10-CM | POA: Diagnosis not present

## 2021-05-16 DIAGNOSIS — I429 Cardiomyopathy, unspecified: Secondary | ICD-10-CM

## 2021-05-16 DIAGNOSIS — M1990 Unspecified osteoarthritis, unspecified site: Secondary | ICD-10-CM

## 2021-05-16 DIAGNOSIS — F418 Other specified anxiety disorders: Secondary | ICD-10-CM

## 2021-05-16 NOTE — Progress Notes (Signed)
   Subjective:   Patient ID: Erin Mccann, female    DOB: [birth year], 65 y.o.   MRN: [id number]  HPI The patient is a 65 YO female coming in new for ongoing care of chronic problems and new diarrhea (happened when she started a weight watchers plan with some food supplements, she tracked a certain ingredient and has stopped anything with that in it, none today after stopping for about 4-5 days).   PMH, [facility], social history reviewed and updated  Review of Systems  Constitutional: Negative.   HENT: Negative.    Eyes: Negative.   Respiratory:  Negative for cough, chest tightness and shortness of breath.   Cardiovascular:  Negative for chest pain, palpitations and leg swelling.  Gastrointestinal:  Positive for diarrhea. Negative for abdominal distention, abdominal pain, constipation, nausea and vomiting.  Musculoskeletal: Negative.   Skin: Negative.   Neurological: Negative.   Psychiatric/Behavioral: Negative.     Objective:  Physical Exam Constitutional:      Appearance: She is well-developed.  HENT:     Head: Normocephalic and atraumatic.  Cardiovascular:     Rate and Rhythm: Normal rate and regular rhythm.  Pulmonary:     Effort: Pulmonary effort is normal. No respiratory distress.     Breath sounds: Normal breath sounds. No wheezing or rales.  Abdominal:     General: Bowel sounds are normal. There is no distension.     Palpations: Abdomen is soft.     Tenderness: There is no abdominal tenderness. There is no rebound.  Musculoskeletal:     Cervical back: Normal range of motion.  Skin:    General: Skin is warm and dry.  Neurological:     Mental Status: She is alert and oriented to person, place, and time.     Coordination: Coordination normal.    Vitals:   05/16/21 1541  BP: 124/74  Pulse: 74  Resp: 18  Temp: 98.3 F (36.8 C)  TempSrc: Oral  SpO2: 93%  Weight: 186 lb 3.2 oz (84.5 kg)  Height: 5\' 6"  (1.676 m)    This visit occurred during the SARS-CoV-2 public  health emergency.  Safety protocols were in place, including screening questions prior to the visit, additional usage of staff PPE, and extensive cleaning of exam room while observing appropriate contact time as indicated for disinfecting solutions.   Assessment & Plan:

## 2021-05-16 NOTE — Patient Instructions (Signed)
You can let us know if you want to do the mammogram.

## 2021-05-17 ENCOUNTER — Encounter: Payer: Self-pay | Admitting: Internal Medicine

## 2021-05-17 DIAGNOSIS — R197 Diarrhea, unspecified: Secondary | ICD-10-CM

## 2021-05-17 DIAGNOSIS — M1990 Unspecified osteoarthritis, unspecified site: Secondary | ICD-10-CM | POA: Insufficient documentation

## 2021-05-17 DIAGNOSIS — F418 Other specified anxiety disorders: Secondary | ICD-10-CM | POA: Insufficient documentation

## 2021-05-17 MED ORDER — ALPRAZOLAM 0.25 MG PO TABS: 0.2500 mg | ORAL_TABLET | Freq: Every day | ORAL | 3 refills | Status: DC | PRN

## 2021-05-17 NOTE — Assessment & Plan Note (Signed)
Not on medication currently. Checking records for recent labs.

## 2021-05-17 NOTE — Assessment & Plan Note (Signed)
Seeing cardiology regularly and has had recent workup. BP at goal. No anginal symptoms.

## 2021-05-17 NOTE — Assessment & Plan Note (Signed)
Suspect due to dietary ingredient. Advised to avoid that ingredient and if no improvement let us know. Getting records for last colonoscopy which to patient recall is within 10 years.

## 2021-05-17 NOTE — Assessment & Plan Note (Signed)
Taking celebrex 200 mg BID and will continue. Getting records for recent renal function.

## 2021-05-17 NOTE — Assessment & Plan Note (Signed)
Taking coreg 3.125 mg BID and lisinopril 2.5 mg daily. Getting records as she states recent labs.

## 2021-05-17 NOTE — Assessment & Plan Note (Signed)
Rx xanax which she has had previously and uses very rarely.

## 2021-05-20 ENCOUNTER — Other Ambulatory Visit: Payer: Self-pay | Admitting: Internal Medicine

## 2021-05-20 ENCOUNTER — Other Ambulatory Visit: Payer: Self-pay

## 2021-05-20 ENCOUNTER — Ambulatory Visit (INDEPENDENT_AMBULATORY_CARE_PROVIDER_SITE_OTHER): Payer: BC Managed Care – PPO

## 2021-05-20 DIAGNOSIS — Z23 Encounter for immunization: Secondary | ICD-10-CM | POA: Diagnosis not present

## 2021-05-20 NOTE — Progress Notes (Signed)
Pt here for  1st Shingles injection per Dr. Okey Dupre  pt tolerated injection well.

## 2021-05-23 ENCOUNTER — Other Ambulatory Visit: Payer: BLUE CROSS/BLUE SHIELD

## 2021-05-23 DIAGNOSIS — R197 Diarrhea, unspecified: Secondary | ICD-10-CM

## 2021-05-27 ENCOUNTER — Encounter: Payer: Self-pay | Admitting: Internal Medicine

## 2021-05-27 DIAGNOSIS — Z8719 Personal history of other diseases of the digestive system: Secondary | ICD-10-CM

## 2021-05-27 DIAGNOSIS — R197 Diarrhea, unspecified: Secondary | ICD-10-CM

## 2021-05-27 LAB — GASTROINTESTINAL PATHOGEN PANEL PCR
C. difficile Tox A/B, PCR: NOT DETECTED
Campylobacter, PCR: NOT DETECTED
Cryptosporidium, PCR: NOT DETECTED
E coli (ETEC) LT/ST PCR: NOT DETECTED
E coli (STEC) stx1/stx2, PCR: NOT DETECTED
E coli 0157, PCR: NOT DETECTED
Giardia lamblia, PCR: NOT DETECTED
Norovirus, PCR: NOT DETECTED
Rotavirus A, PCR: NOT DETECTED
Salmonella, PCR: NOT DETECTED
Shigella, PCR: NOT DETECTED

## 2021-06-20 ENCOUNTER — Encounter: Payer: Self-pay | Admitting: Gastroenterology

## 2021-06-20 ENCOUNTER — Ambulatory Visit (INDEPENDENT_AMBULATORY_CARE_PROVIDER_SITE_OTHER): Payer: BC Managed Care – PPO | Admitting: Gastroenterology

## 2021-06-20 VITALS — BP 120/60 | HR 79 | Ht 66.0 in | Wt 185.4 lb

## 2021-06-20 DIAGNOSIS — R197 Diarrhea, unspecified: Secondary | ICD-10-CM | POA: Diagnosis not present

## 2021-06-20 NOTE — Patient Instructions (Signed)
If you are age 63 or older, your body mass index should be between 23-30. Your Body mass index is 29.92 kg/m. If this is out of the aforementioned range listed, please consider follow up with your Primary Care Provider.  If you are age 36 or younger, your body mass index should be between 19-25. Your Body mass index is 29.92 kg/m. If this is out of the aformentioned range listed, please consider follow up with your Primary Care Provider.    The [facility] providers would like to encourage you to use [facility] to communicate with providers for non-urgent requests or questions.  Due to long hold times on the telephone, sending your provider a message by [facility] may be a faster and more efficient way to get a response.  Please allow 48 business hours for a response.  Please remember that this is for non-urgent requests.   Thank you for choosing me and [facility].  Scott E. Tomasa Rand, [place]

## 2021-06-20 NOTE — Progress Notes (Addendum)
HPI : Erin Mccann is a very pleasant 65 year old female with history of coronary artery disease and cardiomyopathy who was referred to [place] by Dr. Hillard Danker for further evaluation of chronic diarrhea.  Her diarrhea started in July when she started a new diet plan (keto).  She states that for several weeks, she would have profuse watery bowel movements every day, starting usually around 4 in the morning.  She would have at least 10 bowel movements a day.  She had significant urgency with episodes of incontinence, once nocturnally, and once during the day.  She would have crampy abdominal pain associated with the urge to defecate.  No blood in the stool.  No nausea or vomiting.  Her appetite was good.  Her weight has been stable. She saw her primary care provider who obtained a PCR stool pathogen test which was negative.  She started taking Imodium after that, and this seemed to help quite a bit with reducing the frequency and urgency.  Over the next couple weeks, the diarrhea slowed down quite a bit.  Last week, she only took 3 doses of Imodium.  Her last dose was 2 days ago.  Yesterday, she did not have a bowel movement at all. In 2016, she had similar symptoms which lasted about 2 months.  She reports undergoing a colonoscopy to evaluate this, which was reportedly normal.  She thinks she may have had polyps.  Those symptoms resolved spontaneously without specific therapy.  She had normal bowel habits until an acute episode in 2019 when she developed severe abdominal pain and profuse diarrhea.  She was hospitalized for this because of dehydration and was given antibiotics.  Her most recent symptoms are not similar to those symptoms. She denies any recent NSAID use.   No history of SSRI or PPI use.  She has a history of nonobstructive coronary artery disease and a mildly depressed EF dating back to 2012.  She has been followed by a cardiologist out in [facility], (Dr. Penni Bombard).  Patient saw  him last month, and is scheduled to have a repeat echocardiogram in October of this year.  The patient denies symptoms of chest pain or shortness of breath Caucasian   Past Medical History:  Diagnosis Date   CAD (coronary artery disease)    Cardiomyopathy    Cardiomyopathy    Hyperlipidemia    Hypertension    Hypothyroid      Past Surgical History:  Procedure Laterality Date   CARDIAC CATHETERIZATION     TONSILLECTOMY     Family History  Problem Relation Age of Onset   Hypertension Mother    Heart disease Father        CHF   Social History   Tobacco Use   Smoking status: Former    Packs/day: 0.30    Years: 5.00    Pack years: 1.50    Types: Cigarettes    Quit date: 02/19/1977    Years since quitting: 44.3   Smokeless tobacco: Never  Substance Use Topics   Alcohol use: Yes    Comment: Occasional   Current Outpatient Medications  Medication Sig Dispense Refill   ALPRAZolam (XANAX) 0.25 MG tablet Take 1 tablet (0.25 mg total) by mouth daily as needed for anxiety. 30 tablet 3   aspirin EC 81 MG tablet Take 81 mg by mouth daily with lunch.     carvedilol (COREG) 3.125 MG tablet TAKE ONE TABLET BY MOUTH TWICE A DAY WITH MEAL(S) 30 tablet 0  celecoxib (CELEBREX) 200 MG capsule Take 200 mg by mouth 2 (two) times daily.     dapsone 25 MG tablet Take 25 mg by mouth 2 (two) times daily.     levothyroxine (SYNTHROID, LEVOTHROID) 25 MCG tablet TAKE 1 TABLET EACH DAY. 90 tablet 1   lisinopril (PRINIVIL,ZESTRIL) 5 MG tablet Take 0.5 tablets (2.5 mg total) by mouth daily. NEEDS APPOINTMENT FOR FUTURE REFILLS 15 tablet 0   metroNIDAZOLE (METROGEL) 1 % gel Apply 1 application topically at bedtime. Apply to face for rosacea     Multiple Vitamin (MULTI-VITAMIN DAILY PO)      Omega-3 Fatty Acids (FISH OIL PO) Take 1 capsule by mouth daily with lunch.     Probiotic Product (PROBIOTIC PO) Take 1 tablet by mouth daily.     Sulfacetamide Sodium-Sulfur 10-2 % LIQD Apply 1 application  topically at bedtime. Apply to face for rosacea     tacrolimus (PROTOPIC) 0.1 % ointment Apply 1 application topically as needed.     No current facility-administered medications for this visit.   Allergies  Allergen Reactions   Codeine Nausea Only     Review of Systems: All systems reviewed and negative except where noted in HPI.    No results found.  Physical Exam: Ht 5\' 6"  (1.676 m)   Wt 185 lb 6 oz (84.1 kg)   BMI 29.92 kg/m  Constitutional: Pleasant,well-developed, Caucasian female in no acute distress. HEENT: Normocephalic and atraumatic. Conjunctivae are normal. No scleral icterus. Cardiovascular: Normal rate, regular rhythm.  Pulmonary/chest: Effort normal and breath sounds normal. No wheezing, rales or rhonchi. Abdominal: Soft, nondistended, nontender. Bowel sounds active throughout. There are no masses palpable. No hepatomegaly. Extremities: no edema Neurological: Alert and oriented to person place and time. Skin: Skin is warm and dry. No rashes noted. Psychiatric: Normal mood and affect. Behavior is normal.  CBC    Component Value Date/Time   WBC 4.8 04/08/2021 0118   WBC 6.5 04/28/2014 1328   RBC 3.62 (A) 04/08/2021 0118   HGB 11.0 (A) 04/08/2021 0118   HCT 35 (A) 04/08/2021 0118   PLT 193 04/08/2021 0118   MCV 93.5 04/28/2014 1328   MCH 30.5 04/28/2014 1328   MCHC 32.6 04/28/2014 1328   RDW 12.8 04/28/2014 1328    CMP     Component Value Date/Time   NA 135 (A) 04/08/2021 0118   K 4.6 04/08/2021 0118   CL 103 04/08/2021 0118   CO2 22 04/08/2021 0118   GLUCOSE 84 04/28/2014 1328   BUN 23 (A) 04/08/2021 0118   CREATININE 1.0 04/08/2021 0118   CREATININE 0.92 04/28/2014 1328   CALCIUM 9.2 04/08/2021 0118   PROT 6.4 08/19/2014 0744   ALBUMIN 3.9 04/08/2021 0118   AST 21 04/08/2021 0118   ALT 16 04/08/2021 0118   ALKPHOS 68 04/08/2021 0118   BILITOT 0.4 08/19/2014 0744   GFRNONAA 68 (L) 04/28/2014 1328   GFRAA 79 (L) 04/28/2014 1328      ASSESSMENT AND PLAN: 65 year old female with 2 months of profuse diarrhea with urgency and episodes of incontinence, now nearly resolved.  She had a previous episode in 2016 with reportedly a normal colonoscopy at that time.  She also had what sounds like an infectious enterocolitis in 2019 requiring hospitalization.  This episode and her episode in 2016 seem suspicious for microscopic colitis.  She does not take NSAIDs, PPIs or SSRIs.  Her diarrhea has responded well to Imodium and appears to be essentially resolved.  She had no  bowel movements at all yesterday.  We discussed potentially repeating a colonoscopy with random biopsies to evaluate for microscopic colitis versus waiting to see if her symptoms return.  I think it is very reasonable to hold off on a colonoscopy at this time and wait to see if she has recurrence of her diarrhea.  We do need to get a copy of her colonoscopy report from 2016 to make sure there were no polyps and to see if random biopsies were done at that time.  She is going on a trip to [place] next month, and I recommended she take plenty of Imodium in case her symptoms flareup again.  Chronic diarrhea, improving, suspect microscopic colitis - Colonoscopy with random biopsies if symptoms return - Continue as needed loperamide -Obtain colonoscopy report from 2016 -Recommended patient avoid artificial sweeteners to help with diarrhea  Boone Gear E. Tomasa Rand, MD [facility]     Myrlene Broker, *   Colonoscopy report obtained from [facility].  The colonoscopy on Aug 18, 2015 demonstrated a normal appearing colon throughout the entire colon.  Random biopsies were taken and did not demonstrate microscopic colitis.  No polyps noted.  Prep was excellent.  Repeat colonoscopy was recommended in 10 years.  Veroncia Jezek E. Tomasa Rand, MD [facility]

## 2021-07-06 ENCOUNTER — Telehealth: Payer: Self-pay | Admitting: Gastroenterology

## 2021-07-06 NOTE — Telephone Encounter (Signed)
[facility] HIM Dept received 11 pages of medical records from [facility] - sent interoffice mail to [facility] - Dr. Tomasa Rand 07/06/21  Erin Mccann

## 2021-08-15 ENCOUNTER — Ambulatory Visit (INDEPENDENT_AMBULATORY_CARE_PROVIDER_SITE_OTHER): Payer: BC Managed Care – PPO

## 2021-08-15 ENCOUNTER — Other Ambulatory Visit: Payer: Self-pay

## 2021-08-15 DIAGNOSIS — Z23 Encounter for immunization: Secondary | ICD-10-CM | POA: Diagnosis not present

## 2021-08-15 NOTE — Progress Notes (Signed)
Pt here for 2nd shingles injection   Shingrix given IM, and pt tolerated injection well.

## 2021-08-22 ENCOUNTER — Other Ambulatory Visit: Payer: Self-pay | Admitting: Internal Medicine

## 2021-08-22 DIAGNOSIS — I10 Essential (primary) hypertension: Secondary | ICD-10-CM

## 2021-08-22 DIAGNOSIS — E039 Hypothyroidism, unspecified: Secondary | ICD-10-CM

## 2021-10-12 DIAGNOSIS — Z5181 Encounter for therapeutic drug level monitoring: Secondary | ICD-10-CM | POA: Diagnosis not present

## 2021-10-12 DIAGNOSIS — L920 Granuloma annulare: Secondary | ICD-10-CM | POA: Diagnosis not present

## 2021-12-15 DIAGNOSIS — E639 Nutritional deficiency, unspecified: Secondary | ICD-10-CM | POA: Diagnosis not present

## 2021-12-15 DIAGNOSIS — Z713 Dietary counseling and surveillance: Secondary | ICD-10-CM | POA: Diagnosis not present

## 2021-12-15 DIAGNOSIS — E785 Hyperlipidemia, unspecified: Secondary | ICD-10-CM | POA: Diagnosis not present

## 2021-12-15 DIAGNOSIS — E039 Hypothyroidism, unspecified: Secondary | ICD-10-CM | POA: Diagnosis not present

## 2021-12-15 DIAGNOSIS — M2550 Pain in unspecified joint: Secondary | ICD-10-CM | POA: Diagnosis not present

## 2022-01-11 ENCOUNTER — Encounter: Payer: Self-pay | Admitting: Internal Medicine

## 2022-01-12 MED ORDER — POLY-IRON 150 FORTE 150-25-1 MG-MCG-MG PO CAPS: 1.0000 | ORAL_CAPSULE | Freq: Every day | ORAL | 3 refills | Status: DC

## 2022-01-13 DIAGNOSIS — E639 Nutritional deficiency, unspecified: Secondary | ICD-10-CM | POA: Diagnosis not present

## 2022-01-13 DIAGNOSIS — M2550 Pain in unspecified joint: Secondary | ICD-10-CM | POA: Diagnosis not present

## 2022-01-13 DIAGNOSIS — Z713 Dietary counseling and surveillance: Secondary | ICD-10-CM | POA: Diagnosis not present

## 2022-01-13 DIAGNOSIS — E785 Hyperlipidemia, unspecified: Secondary | ICD-10-CM | POA: Diagnosis not present

## 2022-02-21 DIAGNOSIS — L920 Granuloma annulare: Secondary | ICD-10-CM | POA: Diagnosis not present

## 2022-02-21 DIAGNOSIS — Z5181 Encounter for therapeutic drug level monitoring: Secondary | ICD-10-CM | POA: Diagnosis not present

## 2022-04-17 DIAGNOSIS — H903 Sensorineural hearing loss, bilateral: Secondary | ICD-10-CM | POA: Diagnosis not present

## 2022-04-28 ENCOUNTER — Other Ambulatory Visit: Payer: Self-pay | Admitting: Internal Medicine

## 2022-04-28 DIAGNOSIS — E039 Hypothyroidism, unspecified: Secondary | ICD-10-CM

## 2022-04-28 DIAGNOSIS — I10 Essential (primary) hypertension: Secondary | ICD-10-CM

## 2022-05-31 DIAGNOSIS — L920 Granuloma annulare: Secondary | ICD-10-CM | POA: Diagnosis not present

## 2022-06-27 DIAGNOSIS — Z1231 Encounter for screening mammogram for malignant neoplasm of breast: Secondary | ICD-10-CM | POA: Diagnosis not present

## 2022-06-27 DIAGNOSIS — Z01419 Encounter for gynecological examination (general) (routine) without abnormal findings: Secondary | ICD-10-CM | POA: Diagnosis not present

## 2022-06-27 DIAGNOSIS — Z6831 Body mass index (BMI) 31.0-31.9, adult: Secondary | ICD-10-CM | POA: Diagnosis not present

## 2022-07-03 LAB — HM MAMMOGRAPHY

## 2022-09-05 DIAGNOSIS — L719 Rosacea, unspecified: Secondary | ICD-10-CM | POA: Diagnosis not present

## 2022-09-05 DIAGNOSIS — L578 Other skin changes due to chronic exposure to nonionizing radiation: Secondary | ICD-10-CM | POA: Diagnosis not present

## 2022-09-05 DIAGNOSIS — D225 Melanocytic nevi of trunk: Secondary | ICD-10-CM | POA: Diagnosis not present

## 2022-09-05 DIAGNOSIS — B078 Other viral warts: Secondary | ICD-10-CM | POA: Diagnosis not present

## 2022-09-05 DIAGNOSIS — L089 Local infection of the skin and subcutaneous tissue, unspecified: Secondary | ICD-10-CM | POA: Diagnosis not present

## 2022-09-05 DIAGNOSIS — L570 Actinic keratosis: Secondary | ICD-10-CM | POA: Diagnosis not present

## 2022-09-14 ENCOUNTER — Other Ambulatory Visit: Payer: Self-pay | Admitting: Internal Medicine

## 2022-09-18 ENCOUNTER — Encounter: Payer: Self-pay | Admitting: Internal Medicine

## 2022-09-22 ENCOUNTER — Ambulatory Visit (INDEPENDENT_AMBULATORY_CARE_PROVIDER_SITE_OTHER): Payer: BC Managed Care – PPO | Admitting: Internal Medicine

## 2022-09-22 ENCOUNTER — Encounter: Payer: Self-pay | Admitting: Internal Medicine

## 2022-09-22 ENCOUNTER — Other Ambulatory Visit: Payer: Self-pay | Admitting: Internal Medicine

## 2022-09-22 VITALS — BP 134/82 | HR 88 | Temp 97.6°F | Ht 66.0 in | Wt 193.4 lb

## 2022-09-22 DIAGNOSIS — Z6831 Body mass index (BMI) 31.0-31.9, adult: Secondary | ICD-10-CM

## 2022-09-22 DIAGNOSIS — Z23 Encounter for immunization: Secondary | ICD-10-CM

## 2022-09-22 DIAGNOSIS — E6609 Other obesity due to excess calories: Secondary | ICD-10-CM

## 2022-09-22 DIAGNOSIS — E039 Hypothyroidism, unspecified: Secondary | ICD-10-CM

## 2022-09-22 DIAGNOSIS — E782 Mixed hyperlipidemia: Secondary | ICD-10-CM

## 2022-09-22 DIAGNOSIS — I429 Cardiomyopathy, unspecified: Secondary | ICD-10-CM

## 2022-09-22 DIAGNOSIS — Z0001 Encounter for general adult medical examination with abnormal findings: Secondary | ICD-10-CM

## 2022-09-22 DIAGNOSIS — M1990 Unspecified osteoarthritis, unspecified site: Secondary | ICD-10-CM

## 2022-09-22 DIAGNOSIS — E669 Obesity, unspecified: Secondary | ICD-10-CM | POA: Insufficient documentation

## 2022-09-22 DIAGNOSIS — I10 Essential (primary) hypertension: Secondary | ICD-10-CM | POA: Diagnosis not present

## 2022-09-22 LAB — LIPID PANEL
Cholesterol: 248 mg/dL — ABNORMAL HIGH (ref 0–200)
HDL: 56 mg/dL (ref >39.00)
NonHDL: 192.37
Total CHOL/HDL Ratio: 4
Triglycerides: 291 mg/dL — ABNORMAL HIGH (ref 0.0–149.0)
VLDL: 58.2 mg/dL — ABNORMAL HIGH (ref 0.0–40.0)

## 2022-09-22 LAB — COMPREHENSIVE METABOLIC PANEL
ALT: 16 U/L (ref 0–35)
AST: 20 U/L (ref 0–37)
Albumin: 4.3 g/dL (ref 3.5–5.2)
Alkaline Phosphatase: 70 U/L (ref 39–117)
BUN: 23 mg/dL (ref 6–23)
CO2: 29 mEq/L (ref 19–32)
Calcium: 9.6 mg/dL (ref 8.4–10.5)
Chloride: 102 mEq/L (ref 96–112)
Creatinine, Ser: 0.99 mg/dL (ref 0.40–1.20)
GFR: 60.18 mL/min (ref >60.00)
Glucose, Bld: 78 mg/dL (ref 70–99)
Potassium: 4.4 mEq/L (ref 3.5–5.1)
Sodium: 137 mEq/L (ref 135–145)
Total Bilirubin: 0.5 mg/dL (ref 0.2–1.2)
Total Protein: 6.9 g/dL (ref 6.0–8.3)

## 2022-09-22 LAB — CBC
HCT: 36.1 % (ref 36.0–46.0)
Hemoglobin: 12 g/dL (ref 12.0–15.0)
MCHC: 33.3 g/dL (ref 30.0–36.0)
MCV: 93.9 fl (ref 78.0–100.0)
Platelets: 241 10*3/uL (ref 150.0–400.0)
RBC: 3.84 Mil/uL — ABNORMAL LOW (ref 3.87–5.11)
RDW: 13.7 % (ref 11.5–15.5)
WBC: 5.1 10*3/uL (ref 4.0–10.5)

## 2022-09-22 LAB — LDL CHOLESTEROL, DIRECT: Direct LDL: 157 mg/dL

## 2022-09-22 LAB — VITAMIN B12: Vitamin B-12: 650 pg/mL (ref 211–911)

## 2022-09-22 LAB — VITAMIN D 25 HYDROXY (VIT D DEFICIENCY, FRACTURES): VITD: 26.55 ng/mL — ABNORMAL LOW (ref 30.00–100.00)

## 2022-09-22 LAB — HEMOGLOBIN A1C: Hgb A1c MFr Bld: 4 % — ABNORMAL LOW (ref 4.6–6.5)

## 2022-09-22 LAB — TSH: TSH: 3.43 u[IU]/mL (ref 0.35–5.50)

## 2022-09-22 MED ORDER — SEMAGLUTIDE-WEIGHT MANAGEMENT 1 MG/0.5ML ~~LOC~~ SOAJ: 1.0000 mg | SUBCUTANEOUS | 0 refills | Status: AC

## 2022-09-22 MED ORDER — SEMAGLUTIDE-WEIGHT MANAGEMENT 0.25 MG/0.5ML ~~LOC~~ SOAJ: 0.2500 mg | SUBCUTANEOUS | 0 refills | Status: AC

## 2022-09-22 MED ORDER — LISINOPRIL 5 MG PO TABS: 2.5000 mg | ORAL_TABLET | Freq: Every day | ORAL | 0 refills | Status: DC

## 2022-09-22 MED ORDER — SEMAGLUTIDE-WEIGHT MANAGEMENT 2.4 MG/0.75ML ~~LOC~~ SOAJ: 2.4000 mg | SUBCUTANEOUS | 0 refills | Status: DC

## 2022-09-22 MED ORDER — LISINOPRIL 5 MG PO TABS: 2.5000 mg | ORAL_TABLET | Freq: Every day | ORAL | 3 refills | Status: DC

## 2022-09-22 MED ORDER — CARVEDILOL 3.125 MG PO TABS: 3.1250 mg | ORAL_TABLET | Freq: Two times a day (BID) | ORAL | 3 refills | Status: DC

## 2022-09-22 MED ORDER — LEVOTHYROXINE SODIUM 50 MCG PO TABS: 50.0000 ug | ORAL_TABLET | Freq: Every day | ORAL | 3 refills | Status: DC

## 2022-09-22 MED ORDER — SEMAGLUTIDE-WEIGHT MANAGEMENT 0.5 MG/0.5ML ~~LOC~~ SOAJ: 0.5000 mg | SUBCUTANEOUS | 0 refills | Status: AC

## 2022-09-22 MED ORDER — SEMAGLUTIDE-WEIGHT MANAGEMENT 1.7 MG/0.75ML ~~LOC~~ SOAJ: 1.7000 mg | SUBCUTANEOUS | 0 refills | Status: AC

## 2022-09-22 NOTE — Assessment & Plan Note (Signed)
Checking vitamin D and B12. No chest pains or SOB. Continue aspirin 81 mg daily and coreg 3.125 mg BID.

## 2022-09-22 NOTE — Assessment & Plan Note (Signed)
BP at goal. Checking CBC and CMP. Continue coreg 3.125 mg BID and lisinopril 2.5 mg daily and refilled both.

## 2022-09-22 NOTE — Assessment & Plan Note (Signed)
Flu shot declines. Covid-19 counseled. Shingrix complete. Tetanus given at visit. Colonoscopy up to date. Mammogram up to date getting records, pap smear up to date getting records. Counseled about sun safety and mole surveillance. Counseled about the dangers of distracted driving. Given 10 year screening recommendations.

## 2022-09-22 NOTE — Progress Notes (Signed)
   Subjective:   Patient ID: Erin Mccann, female    DOB: 12/16/1955, 66 y.o.   MRN: [id number]  HPI The patient is here for physical with concerns  PMH, [facility], social history reviewed and updated  Review of Systems  Constitutional:  Positive for unexpected weight change.  HENT: Negative.    Eyes: Negative.   Respiratory:  Negative for cough, chest tightness and shortness of breath.   Cardiovascular:  Negative for chest pain, palpitations and leg swelling.  Gastrointestinal:  Negative for abdominal distention, abdominal pain, constipation, diarrhea, nausea and vomiting.  Musculoskeletal:  Positive for arthralgias.  Skin: Negative.   Neurological: Negative.   Psychiatric/Behavioral: Negative.      Objective:  Physical Exam Constitutional:      Appearance: She is well-developed.  HENT:     Head: Normocephalic and atraumatic.  Cardiovascular:     Rate and Rhythm: Normal rate and regular rhythm.  Pulmonary:     Effort: Pulmonary effort is normal. No respiratory distress.     Breath sounds: Normal breath sounds. No wheezing or rales.  Abdominal:     General: Bowel sounds are normal. There is no distension.     Palpations: Abdomen is soft.     Tenderness: There is no abdominal tenderness. There is no rebound.  Musculoskeletal:        General: Tenderness present.     Cervical back: Normal range of motion.  Skin:    General: Skin is warm and dry.  Neurological:     Mental Status: She is alert and oriented to person, place, and time.     Coordination: Coordination normal.     Vitals:   09/22/22 1219  BP: 134/82  Pulse: 88  Temp: 97.6 F (36.4 C)  TempSrc: Oral  SpO2: 90%  Weight: 193 lb 6 oz (87.7 kg)  Height: 5\' 6"  (1.676 m)    Assessment & Plan:  Tdap given at visit

## 2022-09-22 NOTE — Patient Instructions (Signed)
We have sent in the weight loss medicine wegovy to take 0.25 mg weekly for 1 month then increase to 0.5 mg weekly for 1 month then 1 mg weekly for 1 month then return.

## 2022-09-22 NOTE — Assessment & Plan Note (Signed)
With worsening knee pain with 27 pound weight gain. She is taking celebrex 200 mg BID and can refill. Counseled about weight loss and medication prescribed. If not effective can do cortisone injection in the knees.

## 2022-09-22 NOTE — Assessment & Plan Note (Signed)
Checking TSH and adjust synthroid 50 mcg daily as needed. 

## 2022-09-22 NOTE — Assessment & Plan Note (Signed)
Weight is up and she has struggled multiple times with weight loss. Rx wegovy after counseling about weight loss and options. Follow up 3 months. If not covered we will plan for wellbutrin or topiramate.

## 2022-09-22 NOTE — Assessment & Plan Note (Addendum)
Checking lipid panel and adjust diet as needed for LDL goal <100.

## 2022-09-25 ENCOUNTER — Encounter: Payer: Self-pay | Admitting: Internal Medicine

## 2022-09-25 ENCOUNTER — Telehealth: Payer: Self-pay

## 2022-09-25 MED ORDER — ATORVASTATIN CALCIUM 20 MG PO TABS: 20.0000 mg | ORAL_TABLET | Freq: Every day | ORAL | 3 refills | Status: DC

## 2022-09-25 NOTE — Telephone Encounter (Signed)
PA has been sent in.  (Key: [id number])  **Pt has [employer] as her medication coverage due to this meds must be sent in for coverage under this card. Pt has to bring in card to be scanned into chart.

## 2022-10-04 NOTE — Telephone Encounter (Signed)
There is also Zepbound to consider taking if not allergic, this is also indicated for wt loss, but often is not covered by insurance, and cost is likely over $1000 per month.  Let me know if she wants this   Also feel free to see Sports Medicine next available by calling herself or assisted by out office for the knees.

## 2022-11-03 ENCOUNTER — Other Ambulatory Visit: Payer: Self-pay | Admitting: Internal Medicine

## 2022-12-17 ENCOUNTER — Encounter: Payer: Self-pay | Admitting: Internal Medicine

## 2022-12-21 ENCOUNTER — Encounter: Payer: Self-pay | Admitting: Internal Medicine

## 2022-12-22 ENCOUNTER — Ambulatory Visit: Payer: BC Managed Care – PPO | Admitting: Internal Medicine

## 2023-01-22 ENCOUNTER — Other Ambulatory Visit: Payer: Self-pay | Admitting: Internal Medicine

## 2023-01-22 ENCOUNTER — Encounter: Payer: Self-pay | Admitting: Internal Medicine

## 2023-01-22 ENCOUNTER — Ambulatory Visit (INDEPENDENT_AMBULATORY_CARE_PROVIDER_SITE_OTHER): Payer: Medicare HMO | Admitting: Internal Medicine

## 2023-01-22 VITALS — BP 140/80 | HR 83 | Temp 98.0°F | Ht 66.0 in | Wt 193.0 lb

## 2023-01-22 DIAGNOSIS — Z6831 Body mass index (BMI) 31.0-31.9, adult: Secondary | ICD-10-CM

## 2023-01-22 DIAGNOSIS — E6609 Other obesity due to excess calories: Secondary | ICD-10-CM | POA: Diagnosis not present

## 2023-01-22 MED ORDER — ZEPBOUND 10 MG/0.5ML ~~LOC~~ SOAJ: 10.0000 mg | SUBCUTANEOUS | 0 refills | Status: DC

## 2023-01-22 MED ORDER — ZEPBOUND 7.5 MG/0.5ML ~~LOC~~ SOAJ: 7.5000 mg | SUBCUTANEOUS | 0 refills | Status: DC

## 2023-01-22 MED ORDER — ZEPBOUND 5 MG/0.5ML ~~LOC~~ SOAJ: 5.0000 mg | SUBCUTANEOUS | 0 refills | Status: DC

## 2023-01-22 MED ORDER — ZEPBOUND 2.5 MG/0.5ML ~~LOC~~ SOAJ: 2.5000 mg | SUBCUTANEOUS | 0 refills | Status: DC

## 2023-01-22 NOTE — Progress Notes (Unsigned)
   Subjective:   Patient ID: Erin Mccann, female    DOB: [birth year], 67 y.o.   MRN: [id number]  HPI The patient is a 67 YO female coming in for weight.   Review of Systems  Constitutional: Negative.   HENT: Negative.    Eyes: Negative.   Respiratory:  Negative for cough, chest tightness and shortness of breath.   Cardiovascular:  Negative for chest pain, palpitations and leg swelling.  Gastrointestinal:  Negative for abdominal distention, abdominal pain, constipation, diarrhea, nausea and vomiting.  Musculoskeletal:  Positive for arthralgias.  Skin: Negative.   Neurological: Negative.   Psychiatric/Behavioral: Negative.      Objective:  Physical Exam Constitutional:      Appearance: She is well-developed.  HENT:     Head: Normocephalic and atraumatic.  Cardiovascular:     Rate and Rhythm: Normal rate and regular rhythm.  Pulmonary:     Effort: Pulmonary effort is normal. No respiratory distress.     Breath sounds: Normal breath sounds. No wheezing or rales.  Abdominal:     General: Bowel sounds are normal. There is no distension.     Palpations: Abdomen is soft.     Tenderness: There is no abdominal tenderness. There is no rebound.  Musculoskeletal:        General: Tenderness present.     Cervical back: Normal range of motion.  Skin:    General: Skin is warm and dry.  Neurological:     Mental Status: She is alert and oriented to person, place, and time.     Coordination: Coordination normal.     Vitals:   01/22/23 1503 01/22/23 1506  BP: (!) 140/80 (!) 140/80  Pulse: 83   Temp: 98 F (36.7 C)   TempSrc: Oral   SpO2: 94%   Weight: 193 lb (87.5 kg)   Height: 5\' 6"  (1.676 m)     Assessment & Plan:

## 2023-01-22 NOTE — Patient Instructions (Addendum)
We have sent in the zepbound for you.

## 2023-01-23 NOTE — Telephone Encounter (Signed)
Rec'd msg  Alternative Requested:THIS IS NOT COVERED BY INSURANCE. PLEASE CHECK FORMULARY AND ADVISE.

## 2023-01-23 NOTE — Assessment & Plan Note (Signed)
Rx zepbound to see if this is covered and more cost effective than wegovy. Rx done for first 4 months and she will let us know how this is going. Counseled about benefit and risk.

## 2023-02-02 DIAGNOSIS — M25561 Pain in right knee: Secondary | ICD-10-CM | POA: Diagnosis not present

## 2023-02-02 DIAGNOSIS — M25562 Pain in left knee: Secondary | ICD-10-CM | POA: Diagnosis not present

## 2023-02-02 DIAGNOSIS — M170 Bilateral primary osteoarthritis of knee: Secondary | ICD-10-CM | POA: Diagnosis not present

## 2023-02-09 ENCOUNTER — Encounter: Payer: Self-pay | Admitting: Internal Medicine

## 2023-02-12 MED ORDER — ROSUVASTATIN CALCIUM 20 MG PO TABS
20.0000 mg | ORAL_TABLET | Freq: Every day | ORAL | 3 refills | Status: DC
Start: 2023-02-12 — End: 2023-12-25

## 2023-02-14 DIAGNOSIS — M170 Bilateral primary osteoarthritis of knee: Secondary | ICD-10-CM | POA: Diagnosis not present

## 2023-02-21 ENCOUNTER — Telehealth: Payer: Self-pay | Admitting: *Deleted

## 2023-02-21 NOTE — Telephone Encounter (Signed)
   Pre-operative Risk Assessment    Patient Name: Erin Mccann  DOB: 11-02-1955 MRN: [id number]      Request for Surgical Clearance    Procedure:   Left total knee arhtroplasty  Date of Surgery:  Clearance 05/08/23                                 Surgeon:  Dr. Durene Romans Surgeon's Group or Practice Name:  [facility] Phone number:  [phone] Fax number:  [phone]   Type of Clearance Requested:   - Medical  - Pharmacy:  Hold Aspirin Not Indicated   Type of Anesthesia:  Spinal   Additional requests/questions:  Patient has a Dr. Allyson Sabal appointment on 04/13/23. Pre Op added to appt notes.   Signed, Emmit Pomfret   02/21/2023, 3:27 PM

## 2023-02-22 ENCOUNTER — Telehealth: Payer: Self-pay | Admitting: Internal Medicine

## 2023-02-22 NOTE — Telephone Encounter (Signed)
We have received Surgical Clearance from the fax, to be filled out by provider. Patient requested to send it via Fax within 7-days. Document is located in providers tray at front office.Please advise at Mobile [phone] (mobile)   Please fax to: [phone]

## 2023-02-22 NOTE — Telephone Encounter (Signed)
Placed inside Dr Lawana Chambers office box

## 2023-02-22 NOTE — Telephone Encounter (Signed)
Primary Cardiologist:None  Chart reviewed as part of pre-operative protocol coverage. Because of Erin Mccann's past medical history and time since last visit, Erin Mccann will require a follow-up visit in order to better assess preoperative cardiovascular risk.  Pre-op covering staff: -Patient has appointment with Dr. Allyson Sabal on 04/13/2023 at which time clearance can be addressed.  Appointment notes have been updated. - Please contact requesting surgeon's office via preferred method (i.e, phone, fax) to inform them of need for appointment prior to surgery.    Erin Aland, NP-C  02/22/2023, 11:24 AM [address] Office [phone] Fax [phone]

## 2023-02-22 NOTE — Telephone Encounter (Signed)
Pt has appt 04/13/23 with Dr. Allyson Sabal. Pre op clearance to be addressed at appt. I will update all parties involved. Procedure is not until 05/08/23.

## 2023-02-23 NOTE — Telephone Encounter (Signed)
Will need EKG she can get with [place] or cardiology (she already has visit upcoming with them)

## 2023-02-28 NOTE — Telephone Encounter (Signed)
Pt states that she will get the cardiologist to do the EKG and clear her for her surgery

## 2023-03-16 ENCOUNTER — Encounter: Payer: Self-pay | Admitting: Internal Medicine

## 2023-03-19 DIAGNOSIS — Z0100 Encounter for examination of eyes and vision without abnormal findings: Secondary | ICD-10-CM | POA: Diagnosis not present

## 2023-04-13 ENCOUNTER — Ambulatory Visit: Payer: Medicare HMO | Attending: Cardiovascular Disease | Admitting: Cardiovascular Disease

## 2023-04-13 ENCOUNTER — Encounter: Payer: Self-pay | Admitting: Cardiovascular Disease

## 2023-04-13 VITALS — BP 158/82 | HR 78 | Ht 66.0 in | Wt 205.8 lb

## 2023-04-13 DIAGNOSIS — I2583 Coronary atherosclerosis due to lipid rich plaque: Secondary | ICD-10-CM

## 2023-04-13 DIAGNOSIS — I2510 Atherosclerotic heart disease of native coronary artery without angina pectoris: Secondary | ICD-10-CM | POA: Diagnosis not present

## 2023-04-13 DIAGNOSIS — I10 Essential (primary) hypertension: Secondary | ICD-10-CM | POA: Diagnosis not present

## 2023-04-13 DIAGNOSIS — I429 Cardiomyopathy, unspecified: Secondary | ICD-10-CM | POA: Diagnosis not present

## 2023-04-13 DIAGNOSIS — E782 Mixed hyperlipidemia: Secondary | ICD-10-CM

## 2023-04-13 NOTE — Assessment & Plan Note (Signed)
History of essential hypertension blood pressure measured today at 158/82.  She is on carvedilol and lisinopril.

## 2023-04-13 NOTE — Patient Instructions (Addendum)
Medication Instructions:  Your physician recommends that you continue on your current medications as directed. Please refer to the Current Medication list given to you today.  *If you need a refill on your cardiac medications before your next appointment, please call your pharmacy*   Lab Work: Your physician recommends that you return for lab work in: the next week or 2 for FASTING lipid/liver panel  If you have labs (blood work) drawn today and your tests are completely normal, you will receive your results only by: MyChart Message (if you have MyChart) OR A paper copy in the mail If you have any lab test that is abnormal or we need to change your treatment, we will call you to review the results.   Testing/Procedures: Your physician has requested that you have an echocardiogram. Echocardiography is a painless test that uses sound waves to create images of your heart. It provides your doctor with information about the size and shape of your heart and how well your heart's chambers and valves are working. This procedure takes approximately one hour. There are no restrictions for this procedure. Please do NOT wear cologne, perfume, aftershave, or lotions (deodorant is allowed). Please arrive 15 minutes prior to your appointment time. This will take place at [address] [place]    Follow-Up: At [facility], you and your health needs are our priority.  As part of our continuing mission to provide you with exceptional heart care, we have created designated Provider Care Teams.  These Care Teams include your primary Cardiologist (physician) and Advanced Practice Providers (APPs -  Physician Assistants and Nurse Practitioners) who all work together to provide you with the care you need, when you need it.  We recommend signing up for the patient portal called "MyChart".  Sign up information is provided on this After Visit Summary.  MyChart is used to connect with patients for  Virtual Visits (Telemedicine).  Patients are able to view lab/test results, encounter notes, upcoming appointments, etc.  Non-urgent messages can be sent to your provider as well.   To learn more about what you can do with MyChart, go to [email or web address].    Your next appointment:   12 month(s)  Provider:   Nanetta Batty, MD

## 2023-04-13 NOTE — Assessment & Plan Note (Signed)
History of hyperlipidemia recently started on rosuvastatin back in January.  Will recheck a lipid liver profile.  Given her known CAD, her LDL target is less than 70.

## 2023-04-13 NOTE — Assessment & Plan Note (Signed)
History of CAD status post cardiac catheterization performed in 2012 revealing nonobstructive disease.  She was active until recently and denies chest pain.

## 2023-04-13 NOTE — Assessment & Plan Note (Signed)
Her last 2D echo 05/26/2011 showed EF of 35%.  She does complain of some dyspnea although she attributes this to being overweight.  She is on carvedilol and lisinopril.  I am going to obtain a 2D echocardiogram to further evaluate.

## 2023-04-13 NOTE — Progress Notes (Signed)
04/13/2023 Erin Mccann   13-Jun-1956  [id number]  Primary Physician Myrlene Broker, MD Primary Cardiologist: Runell Gess MD Nicholes Calamity, [place]  HPI:  Erin Mccann is a 67 y.o.  married Caucasian female mother of one daughter who  worked as the Engineer, mining of human resources at the [employer]. She was self-referred to be established in my practice for ongoing vascular care. She did see Dr. [name] in the past. I last saw her in the office 08/31/2015.  Since I saw her years ago she is happily moved to [place] and into [facility] and has recently moved back to [place].. Previous studies in [facility] - nuclear study in 2011/06/03 showed a fixed anterior defect most likely related to breast artifact. Her ejection fraction was 42%. A Holter monitor showed no arrhythmia. An echocardiogram showed an ejection fraction of 35%, mild to moderate mitral regurgitation and mild left ventricular enlargement. Cardiac CTA in September of 2012 showed an ejection fraction of 45%, 80% LAD proximally, 80% mid LAD, 70% first diagonal, 70% ramus intermedius. Calcium score was 48. Cardiac catheterization was performed in October of 2012. Ejection fraction was 45% and there was a 60% ramus intermedius. There was a 50% proximal LAD. There was a 30% first diagonal. Fractional flow reserve on the LAD lesion was 0.92 and 0.91 on a ramus intermedius. Her other problems include hypertension and hyperlipidemia.   Since I saw her 8 years ago she has gained 45 pounds in the last 12 months probably related to not exercising because of osteoarthritis of her knees.  She is scheduled to have a left total knee replacement by Dr. Lajoyce Corners  05/08/2023.  She does complain of being somewhat winded when walking up stairs or doing excessive activity but denies chest pain.  She attributes her dyspnea to her weight gain.  Current Meds  Medication Sig   ALPRAZolam (XANAX) 0.25 MG tablet Take 1 tablet (0.25  mg total) by mouth daily as needed for anxiety.   aspirin EC 81 MG tablet Take 81 mg by mouth daily with lunch.   carvedilol (COREG) 3.125 MG tablet Take 1 tablet (3.125 mg total) by mouth 2 (two) times daily.   celecoxib (CELEBREX) 200 MG capsule Take 200 mg by mouth 2 (two) times daily.   Cholecalciferol (VITAMIN D3) 50 MCG (2000 UT) TABS Take by mouth daily.   dapsone 25 MG tablet Take 25 mg by mouth 2 (two) times daily.   FERREX 150 FORTE 150-0.025-1 MG CAPS TAKE 1 CAPSULE BY MOUTH EVERY DAY   levothyroxine (SYNTHROID) 50 MCG tablet Take 1 tablet (50 mcg total) by mouth daily.   lisinopril (ZESTRIL) 5 MG tablet Take 0.5 tablets (2.5 mg total) by mouth daily.   metroNIDAZOLE (METROGEL) 1 % gel Apply 1 application topically at bedtime. Apply to face for rosacea   Multiple Vitamin (MULTI-VITAMIN DAILY PO)    Multiple Vitamins-Minerals (PRESERVISION AREDS 2) CAPS Take by mouth in the morning and at bedtime.   Omega-3 Fatty Acids (FISH OIL PO) Take 1 capsule by mouth daily with lunch.   Probiotic Product (PROBIOTIC PO) Take 1 tablet by mouth daily.   rosuvastatin (CRESTOR) 20 MG tablet Take 1 tablet (20 mg total) by mouth daily.   Sulfacetamide Sodium-Sulfur 10-2 % LIQD Apply 1 application topically at bedtime. Apply to face for rosacea     Allergies  Allergen Reactions   Codeine Nausea Only    Social History  Socioeconomic History   Marital status: Married    Spouse name: Not on file   Number of children: 1   Years of education: Not on file   Highest education level: Bachelor's degree (e.g., BA, AB, BS)  Occupational History    Employer: [employer]    Comment: Associate Professor of [place]  Tobacco Use   Smoking status: Former    Packs/day: 0.30    Years: 5.00    Additional pack years: 0.00    Total pack years: 1.50    Types: Cigarettes    Quit date: 02/19/1977    Years since quitting: 46.1   Smokeless tobacco: Never  Substance and Sexual Activity    Alcohol use: Yes    Comment: Occasional   Drug use: Not on file   Sexual activity: Not on file  Other Topics Concern   Not on file  Social History Narrative   Not on file   Social Determinants of Health   Financial Resource Strain: Low Risk  (01/18/2023)   Overall Financial Resource Strain (CARDIA)    Difficulty of Paying Living Expenses: Not hard at all  Food Insecurity: No Food Insecurity (01/18/2023)   Hunger Vital Sign    Worried About Running Out of Food in the Last Year: Never true    Ran Out of Food in the Last Year: Never true  Transportation Needs: No Transportation Needs (01/18/2023)   PRAPARE - Administrator, Civil Service (Medical): No    Lack of Transportation (Non-Medical): No  Physical Activity: Insufficiently Active (01/18/2023)   Exercise Vital Sign    Days of Exercise per Week: 4 days    Minutes of Exercise per Session: 30 min  Stress: No Stress Concern Present (01/18/2023)   [employer] of Occupational Health - Occupational Stress Questionnaire    Feeling of Stress : Not at all  Social Connections: Moderately Integrated (01/18/2023)   Social Connection and Isolation Panel [NHANES]    Frequency of Communication with Friends and Family: More than three times a week    Frequency of Social Gatherings with Friends and Family: More than three times a week    Attends Religious Services: 1 to 4 times per year    Active Member of [employer] or Organizations: No    Attends Engineer, structural: Not on file    Marital Status: Married  Catering manager Violence: Not on file     Review of Systems: General: negative for chills, fever, night sweats or weight changes.  Cardiovascular: negative for chest pain, dyspnea on exertion, edema, orthopnea, palpitations, paroxysmal nocturnal dyspnea or shortness of breath Dermatological: negative for rash Respiratory: negative for cough or wheezing Urologic: negative for hematuria Abdominal: negative for nausea,  vomiting, diarrhea, bright red blood per rectum, melena, or hematemesis Neurologic: negative for visual changes, syncope, or dizziness All other systems reviewed and are otherwise negative except as noted above.    Blood pressure (!) 158/82, pulse 78, height 5\' 6"  (1.676 m), weight 205 lb 12.8 oz (93.4 kg), SpO2 92 %.  General appearance: alert and no distress Neck: no adenopathy, no carotid bruit, no JVD, supple, symmetrical, trachea midline, and thyroid not enlarged, symmetric, no tenderness/mass/nodules Lungs: clear to auscultation bilaterally Heart: regular rate and rhythm, S1, S2 normal, no murmur, click, rub or gallop Extremities: extremities normal, atraumatic, no cyanosis or edema Pulses: 2+ and symmetric Skin: Skin color, texture, turgor normal. No rashes or lesions Neurologic: Grossly normal  EKG EKG  Interpretation Date/Time:  Friday April 13 2023 14:41:50 EDT Ventricular Rate:  78 PR Interval:  182 QRS Duration:  80 QT Interval:  382 QTC Calculation: 435 R Axis:   -3  Text Interpretation: Normal sinus rhythm Normal ECG When compared with ECG of 28-Apr-2014 13:32, Questionable change in QRS axis Nonspecific T wave abnormality now evident in Inferior leads Nonspecific T wave abnormality now evident in Lateral leads Confirmed by Nanetta Batty [phone]) on 04/13/2023 3:11:04 PM    ASSESSMENT AND PLAN:   Cardiomyopathy (HCC) Her last 2D echo 05/26/2011 showed EF of 35%.  She does complain of some dyspnea although she attributes this to being overweight.  She is on carvedilol and lisinopril.  I am going to obtain a 2D echocardiogram to further evaluate.  CAD (coronary artery disease) History of CAD status post cardiac catheterization performed in 2012 revealing nonobstructive disease.  She was active until recently and denies chest pain.  Hypertension History of essential hypertension blood pressure measured today at 158/82.  She is on carvedilol and  lisinopril.  Hyperlipidemia History of hyperlipidemia recently started on rosuvastatin back in January.  Will recheck a lipid liver profile.  Given her known CAD, her LDL target is less than 70.     Runell Gess MD FACP,FACC,FAHA, [facility] 04/13/2023 3:24 PM

## 2023-04-19 ENCOUNTER — Ambulatory Visit: Payer: Medicare HMO

## 2023-04-19 DIAGNOSIS — I2583 Coronary atherosclerosis due to lipid rich plaque: Secondary | ICD-10-CM | POA: Diagnosis not present

## 2023-04-19 DIAGNOSIS — I8010 Phlebitis and thrombophlebitis of unspecified femoral vein: Secondary | ICD-10-CM | POA: Diagnosis not present

## 2023-04-19 DIAGNOSIS — I3139 Other pericardial effusion (noninflammatory): Secondary | ICD-10-CM

## 2023-04-19 DIAGNOSIS — I5030 Unspecified diastolic (congestive) heart failure: Secondary | ICD-10-CM

## 2023-04-19 DIAGNOSIS — I429 Cardiomyopathy, unspecified: Secondary | ICD-10-CM

## 2023-04-19 DIAGNOSIS — I517 Cardiomegaly: Secondary | ICD-10-CM

## 2023-04-19 DIAGNOSIS — I10 Essential (primary) hypertension: Secondary | ICD-10-CM | POA: Diagnosis not present

## 2023-04-19 DIAGNOSIS — E782 Mixed hyperlipidemia: Secondary | ICD-10-CM | POA: Diagnosis not present

## 2023-04-19 DIAGNOSIS — I2510 Atherosclerotic heart disease of native coronary artery without angina pectoris: Secondary | ICD-10-CM | POA: Diagnosis not present

## 2023-04-20 ENCOUNTER — Encounter: Payer: Self-pay | Admitting: Cardiovascular Disease

## 2023-04-20 DIAGNOSIS — E782 Mixed hyperlipidemia: Secondary | ICD-10-CM | POA: Diagnosis not present

## 2023-04-20 DIAGNOSIS — I2510 Atherosclerotic heart disease of native coronary artery without angina pectoris: Secondary | ICD-10-CM | POA: Diagnosis not present

## 2023-04-20 DIAGNOSIS — I429 Cardiomyopathy, unspecified: Secondary | ICD-10-CM | POA: Diagnosis not present

## 2023-04-20 DIAGNOSIS — I10 Essential (primary) hypertension: Secondary | ICD-10-CM | POA: Diagnosis not present

## 2023-04-20 DIAGNOSIS — I2583 Coronary atherosclerosis due to lipid rich plaque: Secondary | ICD-10-CM | POA: Diagnosis not present

## 2023-04-20 DIAGNOSIS — M1712 Unilateral primary osteoarthritis, left knee: Secondary | ICD-10-CM | POA: Diagnosis not present

## 2023-04-20 LAB — LIPID PANEL
Chol/HDL Ratio: 2.8 ratio (ref 0.0–4.4)
Cholesterol, Total: 163 mg/dL (ref 100–199)
HDL: 58 mg/dL (ref >39)
LDL Chol Calc (NIH): 77 mg/dL (ref 0–99)
Triglycerides: 169 mg/dL — ABNORMAL HIGH (ref 0–149)
VLDL Cholesterol Cal: 28 mg/dL (ref 5–40)

## 2023-04-20 LAB — ECHOCARDIOGRAM COMPLETE
Area-P 1/2: 3.65 cm2
S' Lateral: 3.9 cm

## 2023-04-20 LAB — HEPATIC FUNCTION PANEL
ALT: 22 IU/L (ref 0–32)
AST: 20 IU/L (ref 0–40)
Albumin: 4.7 g/dL (ref 3.9–4.9)
Alkaline Phosphatase: 84 IU/L (ref 44–121)
Bilirubin Total: 0.5 mg/dL (ref 0.0–1.2)
Bilirubin, Direct: 0.16 mg/dL (ref 0.00–0.40)
Total Protein: 7.1 g/dL (ref 6.0–8.5)

## 2023-04-20 NOTE — Telephone Encounter (Signed)
Runell Gess, MD  Bernita Buffy, RN Cleared for ortho surgery  JJB

## 2023-04-23 ENCOUNTER — Encounter: Payer: Self-pay | Admitting: Internal Medicine

## 2023-04-24 NOTE — Patient Instructions (Signed)
SURGICAL WAITING ROOM VISITATION  Patients having surgery or a procedure may have no more than 2 support people in the waiting area - these visitors may rotate.    Children under the age of 44 must have an adult with them who is not the patient.  Due to an increase in RSV and influenza rates and associated hospitalizations, children ages 81 and under may not visit patients in [facility] hospitals.  If the patient needs to stay at the hospital during part of their recovery, the visitor guidelines for inpatient rooms apply. Pre-op nurse will coordinate an appropriate time for 1 support person to accompany patient in pre-op.  This support person may not rotate.    Please refer to the [facility] website for the visitor guidelines for Inpatients (after your surgery is over and you are in a regular room).    Your procedure is scheduled on: 05/08/23   Report to [facility] Main Entrance    Report to admitting at 5:15 AM   Call this number if you have problems the morning of surgery [phone]   Do not eat food :After Midnight.   After Midnight you may have the following liquids until 4:15 AM DAY OF SURGERY  Water Non-Citrus Juices (without pulp, NO RED-Apple, White grape, White cranberry) Black Coffee (NO MILK/CREAM OR CREAMERS, sugar ok)  Clear Tea (NO MILK/CREAM OR CREAMERS, sugar ok) regular and decaf                             Plain Jell-O (NO RED)                                           Fruit ices (not with fruit pulp, NO RED)                                     Popsicles (NO RED)                                                               Sports drinks like Gatorade (NO RED)                 The day of surgery:  Drink ONE (1) Pre-Surgery Clear Ensure at 4:15 AM the morning of surgery. Drink in one sitting. Do not sip.  This drink was given to you during your hospital  pre-op appointment visit. Nothing else to drink after completing the  Pre-Surgery Clear  Ensure.          If you have questions, please contact your surgeon's office.   FOLLOW BOWEL PREP AND ANY ADDITIONAL PRE OP INSTRUCTIONS YOU RECEIVED FROM YOUR SURGEON'S OFFICE!!!     Oral Hygiene is also important to reduce your risk of infection.                                    Remember - BRUSH YOUR TEETH THE MORNING OF SURGERY WITH YOUR REGULAR TOOTHPASTE  DENTURES WILL  BE REMOVED PRIOR TO SURGERY PLEASE DO NOT APPLY "Poly grip" OR ADHESIVES!!!   Take these medicines the morning of surgery with A SIP OF WATER: Tylenol, Alprazolam, Carvedilol, Levothyroxine, Rosuvastatin                               You may not have any metal on your body including hair pins, jewelry, and body piercing             Do not wear make-up, lotions, powders, perfumes, or deodorant  Do not wear nail polish including gel and S&S, artificial/acrylic nails, or any other type of covering on natural nails including finger and toenails. If you have artificial nails, gel coating, etc. that needs to be removed by a nail salon please have this removed prior to surgery or surgery may need to be canceled/ delayed if the surgeon/ anesthesia feels like they are unable to be safely monitored.   Do not shave  48 hours prior to surgery.    Do not bring valuables to the hospital. [facility] IS NOT             RESPONSIBLE   FOR VALUABLES.   Contacts, glasses, dentures or bridgework may not be worn into surgery.   Bring small overnight bag day of surgery.   DO NOT BRING YOUR HOME MEDICATIONS TO THE HOSPITAL. PHARMACY WILL DISPENSE MEDICATIONS LISTED ON YOUR MEDICATION LIST TO YOU DURING YOUR ADMISSION IN THE HOSPITAL!   Special Instructions: Bring a copy of your healthcare power of attorney and living will documents the day of surgery if you haven't scanned them before.              Please read over the following fact sheets you were given: IF YOU HAVE QUESTIONS ABOUT YOUR PRE-OP INSTRUCTIONS PLEASE CALL  [name]   If you received a COVID test during your pre-op visit  it is requested that you wear a mask when out in public, stay away from anyone that may not be feeling well and notify your surgeon if you develop symptoms. If you test positive for Covid or have been in contact with anyone that has tested positive in the last 10 days please notify you surgeon.      Pre-operative 5 CHG Bath Instructions   You can play a key role in reducing the risk of infection after surgery. Your skin needs to be as free of germs as possible. You can reduce the number of germs on your skin by washing with CHG (chlorhexidine gluconate) soap before surgery. CHG is an antiseptic soap that kills germs and continues to kill germs even after washing.   DO NOT use if you have an allergy to chlorhexidine/CHG or antibacterial soaps. If your skin becomes reddened or irritated, stop using the CHG and notify one of our RNs at [phone].   Please shower with the CHG soap starting 4 days before surgery using the following schedule:     Please keep in mind the following:  DO NOT shave, including legs and underarms, starting the day of your first shower.   You may shave your face at any point before/day of surgery.  Place clean sheets on your bed the day you start using CHG soap. Use a clean washcloth (not used since being washed) for each shower. DO NOT sleep with pets once you start using the CHG.   CHG Shower Instructions:  If you choose  to wash your hair and private area, wash first with your normal shampoo/soap.  After you use shampoo/soap, rinse your hair and body thoroughly to remove shampoo/soap residue.  Turn the water OFF and apply about 3 tablespoons (45 ml) of CHG soap to a CLEAN washcloth.  Apply CHG soap ONLY FROM YOUR NECK DOWN TO YOUR TOES (washing for 3-5 minutes)  DO NOT use CHG soap on face, private areas, open wounds, or sores.  Pay special attention to the area where your surgery is  being performed.  If you are having back surgery, having someone wash your back for you may be helpful. Wait 2 minutes after CHG soap is applied, then you may rinse off the CHG soap.  Pat dry with a clean towel  Put on clean clothes/pajamas   If you choose to wear lotion, please use ONLY the CHG-compatible lotions on the back of this paper.     Additional instructions for the day of surgery: DO NOT APPLY any lotions, deodorants, cologne, or perfumes.   Put on clean/comfortable clothes.  Brush your teeth.  Ask your nurse before applying any prescription medications to the skin.      CHG Compatible Lotions   Aveeno Moisturizing lotion  Cetaphil Moisturizing Cream  Cetaphil Moisturizing Lotion  Clairol Herbal Essence Moisturizing Lotion, Dry Skin  Clairol Herbal Essence Moisturizing Lotion, Extra Dry Skin  Clairol Herbal Essence Moisturizing Lotion, Normal Skin  Curel Age Defying Therapeutic Moisturizing Lotion with Alpha Hydroxy  Curel Extreme Care Body Lotion  Curel Soothing Hands Moisturizing Hand Lotion  Curel Therapeutic Moisturizing Cream, Fragrance-Free  Curel Therapeutic Moisturizing Lotion, Fragrance-Free  Curel Therapeutic Moisturizing Lotion, Original Formula  Eucerin Daily Replenishing Lotion  Eucerin Dry Skin Therapy Plus Alpha Hydroxy Crme  Eucerin Dry Skin Therapy Plus Alpha Hydroxy Lotion  Eucerin Original Crme  Eucerin Original Lotion  Eucerin Plus Crme Eucerin Plus Lotion  Eucerin TriLipid Replenishing Lotion  Keri Anti-Bacterial Hand Lotion  Keri Deep Conditioning Original Lotion Dry Skin Formula Softly Scented  Keri Deep Conditioning Original Lotion, Fragrance Free Sensitive Skin Formula  Keri Lotion Fast Absorbing Fragrance Free Sensitive Skin Formula  Keri Lotion Fast Absorbing Softly Scented Dry Skin Formula  Keri Original Lotion  Keri Skin Renewal Lotion Keri Silky Smooth Lotion  Keri Silky Smooth Sensitive Skin Lotion  Nivea Body Creamy  Conditioning Oil  Nivea Body Extra Enriched Lotion  Nivea Body Original Lotion  Nivea Body Sheer Moisturizing Lotion Nivea Crme  Nivea Skin Firming Lotion  NutraDerm 30 Skin Lotion  NutraDerm Skin Lotion  NutraDerm Therapeutic Skin Cream  NutraDerm Therapeutic Skin Lotion  ProShield Protective Hand Cream  Provon moisturizing lotion   Incentive Spirometer  An incentive spirometer is a tool that can help keep your lungs clear and active. This tool measures how well you are filling your lungs with each breath. Taking long deep breaths may help reverse or decrease the chance of developing breathing (pulmonary) problems (especially infection) following: A long period of time when you are unable to move or be active. BEFORE THE PROCEDURE  If the spirometer includes an indicator to show your best effort, your nurse or respiratory therapist will set it to a desired goal. If possible, sit up straight or lean slightly forward. Try not to slouch. Hold the incentive spirometer in an upright position. INSTRUCTIONS FOR USE  Sit on the edge of your bed if possible, or sit up as far as you can in bed or on a chair. Hold the incentive spirometer  in an upright position. Breathe out normally. Place the mouthpiece in your mouth and seal your lips tightly around it. Breathe in slowly and as deeply as possible, raising the piston or the ball toward the top of the column. Hold your breath for 3-5 seconds or for as long as possible. Allow the piston or ball to fall to the bottom of the column. Remove the mouthpiece from your mouth and breathe out normally. Rest for a few seconds and repeat Steps 1 through 7 at least 10 times every 1-2 hours when you are awake. Take your time and take a few normal breaths between deep breaths. The spirometer may include an indicator to show your best effort. Use the indicator as a goal to work toward during each repetition. After each set of 10 deep breaths, practice coughing  to be sure your lungs are clear. If you have an incision (the cut made at the time of surgery), support your incision when coughing by placing a pillow or rolled up towels firmly against it. Once you are able to get out of bed, walk around indoors and cough well. You may stop using the incentive spirometer when instructed by your caregiver.  RISKS AND COMPLICATIONS Take your time so you do not get dizzy or light-headed. If you are in pain, you may need to take or ask for pain medication before doing incentive spirometry. It is harder to take a deep breath if you are having pain. AFTER USE Rest and breathe slowly and easily. It can be helpful to keep track of a log of your progress. Your caregiver can provide you with a simple table to help with this. If you are using the spirometer at home, follow these instructions: SEEK MEDICAL CARE IF:  You are having difficultly using the spirometer. You have trouble using the spirometer as often as instructed. Your pain medication is not giving enough relief while using the spirometer. You develop fever of 100.5 F (38.1 C) or higher. SEEK IMMEDIATE MEDICAL CARE IF:  You cough up bloody sputum that had not been present before. You develop fever of 102 F (38.9 C) or greater. You develop worsening pain at or near the incision site. MAKE SURE YOU:  Understand these instructions. Will watch your condition. Will get help right away if you are not doing well or get worse. Document Released: 02/05/2007 Document Revised: 12/18/2011 Document Reviewed: 04/08/2007 [facility] Patient Information 2014 [place], [place].   ________________________________________________________________________

## 2023-04-24 NOTE — Progress Notes (Signed)
COVID Vaccine Completed: yes  Date of COVID positive in last 90 days:  PCP - Hillard Danker, MD LOV 01/22/23 Cardiologist - Nanetta Batty, MD LOV 04/13/23  Chest x-ray -  EKG - 04/13/23 Epic Stress Test - 05/26/11 Epic ECHO - 04/20/23 Epic Cardiac Cath - 07/11/11 Epic Pacemaker/ICD device last checked: Spinal Cord Stimulator:  Bowel Prep -   Sleep Study -  CPAP -   Fasting Blood Sugar -  Checks Blood Sugar _____ times a day  Last dose of GLP1 agonist-  N/A GLP1 instructions:  N/A   Last dose of SGLT-2 inhibitors-  N/A SGLT-2 instructions: N/A   Blood Thinner Instructions:  Time Aspirin Instructions: ASA 81 Last Dose:  Activity level:  Can go up a flight of stairs and perform activities of daily living without stopping and without symptoms of chest pain or shortness of breath.  Able to exercise without symptoms  Unable to go up a flight of stairs without symptoms of     Anesthesia review: CAD, cardiomyopathy, HTN, needs clearances   Patient denies shortness of breath, fever, cough and chest pain at PAT appointment  Patient verbalized understanding of instructions that were given to them at the PAT appointment. Patient was also instructed that they will need to review over the PAT instructions again at home before surgery.

## 2023-04-25 ENCOUNTER — Encounter (HOSPITAL_COMMUNITY): Payer: Self-pay

## 2023-04-25 ENCOUNTER — Encounter (HOSPITAL_COMMUNITY)
Admission: RE | Admit: 2023-04-25 | Discharge: 2023-04-25 | Disposition: A | Discharge status: Home / Self Care | Payer: Medicare HMO | Source: Ambulatory Visit | Attending: Orthopedic Surgery | Admitting: Orthopedic Surgery

## 2023-04-25 ENCOUNTER — Other Ambulatory Visit: Payer: Self-pay

## 2023-04-25 VITALS — BP 178/92 | HR 78 | Temp 97.8°F | Resp 16 | Ht 66.0 in | Wt 203.0 lb

## 2023-04-25 DIAGNOSIS — Z01818 Encounter for other preprocedural examination: Secondary | ICD-10-CM

## 2023-04-25 DIAGNOSIS — Z01812 Encounter for preprocedural laboratory examination: Secondary | ICD-10-CM | POA: Diagnosis not present

## 2023-04-25 DIAGNOSIS — I10 Essential (primary) hypertension: Secondary | ICD-10-CM | POA: Insufficient documentation

## 2023-04-25 HISTORY — DX: Personal history of urinary calculi: Z87.442

## 2023-04-25 HISTORY — DX: Inflammatory liver disease, unspecified: K75.9

## 2023-04-25 HISTORY — DX: Unspecified osteoarthritis, unspecified site: M19.90

## 2023-04-25 LAB — BASIC METABOLIC PANEL
Anion gap: 7 (ref 5–15)
BUN: 26 mg/dL — ABNORMAL HIGH (ref 8–23)
CO2: 24 mmol/L (ref 22–32)
Calcium: 9.2 mg/dL (ref 8.9–10.3)
Chloride: 106 mmol/L (ref 98–111)
Creatinine, Ser: 0.95 mg/dL (ref 0.44–1.00)
GFR, Estimated: >60 mL/min (ref >60)
Glucose, Bld: 95 mg/dL (ref 70–99)
Potassium: 4.2 mmol/L (ref 3.5–5.1)
Sodium: 137 mmol/L (ref 135–145)

## 2023-04-25 LAB — SURGICAL PCR SCREEN
MRSA, PCR: NEGATIVE
Staphylococcus aureus: NEGATIVE

## 2023-04-25 LAB — CBC
HCT: 37.5 % (ref 36.0–46.0)
Hemoglobin: 11.5 g/dL — ABNORMAL LOW (ref 12.0–15.0)
MCH: 29.7 pg (ref 26.0–34.0)
MCHC: 30.7 g/dL (ref 30.0–36.0)
MCV: 96.9 fL (ref 80.0–100.0)
Platelets: 204 10*3/uL (ref 150–400)
RBC: 3.87 MIL/uL (ref 3.87–5.11)
RDW: 14 % (ref 11.5–15.5)
WBC: 4.8 10*3/uL (ref 4.0–10.5)
nRBC: 0 % (ref 0.0–0.2)

## 2023-04-29 ENCOUNTER — Other Ambulatory Visit: Payer: Self-pay | Admitting: Internal Medicine

## 2023-05-01 ENCOUNTER — Encounter (HOSPITAL_COMMUNITY): Payer: Self-pay

## 2023-05-01 NOTE — Anesthesia Preprocedure Evaluation (Addendum)
Anesthesia Evaluation  Patient identified by MRN, date of birth, ID band Patient awake    Reviewed: Allergy & Precautions, H&P , NPO status , Patient's Chart, lab work & pertinent test results  Airway Mallampati: II  TM Distance: >3 FB Neck ROM: Full    Dental no notable dental hx.    Pulmonary neg pulmonary ROS, former smoker   Pulmonary exam normal breath sounds clear to auscultation       Cardiovascular hypertension, Normal cardiovascular exam Rhythm:Regular Rate:Normal     Neuro/Psych negative neurological ROS  negative psych ROS   GI/Hepatic negative GI ROS, Neg liver ROS,,,  Endo/Other  Hypothyroidism    Renal/GU negative Renal ROS  negative genitourinary   Musculoskeletal  (+) Arthritis , Osteoarthritis,    Abdominal   Peds negative pediatric ROS (+)  Hematology negative hematology ROS (+)   Anesthesia Other Findings   Reproductive/Obstetrics negative OB ROS                             Anesthesia Physical Anesthesia Plan  ASA: 2  Anesthesia Plan: Spinal   Post-op Pain Management: Regional block*   Induction: Intravenous  PONV Risk Score and Plan: 2 and Ondansetron, Dexamethasone, Propofol infusion and Treatment may vary due to age or medical condition  Airway Management Planned: Simple Face Mask  Additional Equipment:   Intra-op Plan:   Post-operative Plan:   Informed Consent: I have reviewed the patients History and Physical, chart, labs and discussed the procedure including the risks, benefits and alternatives for the proposed anesthesia with the patient or authorized representative who has indicated his/her understanding and acceptance.     Dental advisory given  Plan Discussed with: CRNA and Surgeon  Anesthesia Plan Comments: (See PAT note from 7/17 by Sherlie Ban PA-C )        Anesthesia Quick Evaluation

## 2023-05-01 NOTE — Progress Notes (Signed)
Choose an anesthesia record to view details        DISCUSSION: Erin Mccann is a 67 yo female who presents to PAT prior to left TKA. PMH significant for former smoking (quit 1978), HTN, hx of cardiomyopathy, HLD, CAD, hypothyroidism, mild-mod MR.  No prior anesthesia complications.  Patient was seen by cardiology on 04/13/23 for follow up and cardiac clearance. Overall doing well from cardiac standpoint although patient does complain of weight gain and DOE which is attributed to deconditioning. An echo was recommended and completed on 7/11 which showed: "Low normal LVEF at 50 to 55% with grade 1 diastolic dysfunction.  Mild LVH.  Small pericardial effusion." Cleared for surgery without any further testing.  Patient follows with PCP for other chronic medical issues. Noted to be stable when last seen on 01/22/23. Cleared for surgery by PCP  VS: BP (!) 178/92   Pulse 78   Temp 36.6 C (Oral)   Resp 16   Ht 5\' 6"  (1.676 m)   Wt 92.1 kg   SpO2 97%   BMI 32.77 kg/m   PROVIDERS: Myrlene Broker, MD Cardiologist: Dr. Allyson Sabal  LABS: Labs reviewed: Acceptable for surgery. (all labs ordered are listed, but only abnormal results are displayed)  Labs Reviewed  BASIC METABOLIC PANEL - Abnormal; Notable for the following components:      Result Value   BUN 26 (*)    All other components within normal limits  CBC - Abnormal; Notable for the following components:   Hemoglobin 11.5 (*)    All other components within normal limits  SURGICAL PCR SCREEN     IMAGES:   EKG 04/13/23: Text Interpretation:Normal sinus rhythm Normal ECG When compared with ECG of 28-Apr-2014 13:32, Questionable change in QRS axis Nonspecific T wave abnormality now evident in Inferior leads Nonspecific T wave abnormality now evident in Lateral leads Confirmed by Nanetta Batty [phone]) on 04/13/2023 3:11:04 PM    CV:  Echo 04/19/23:  IMPRESSIONS     1. Left ventricular ejection fraction, by estimation, is 50  to 55%. Left  ventricular ejection fraction by 3D volume is 54 %. The left ventricle has  low normal function. The left ventricle has no regional wall motion  abnormalities. There is mild left  ventricular hypertrophy. Left ventricular diastolic parameters are  consistent with Grade I diastolic dysfunction (impaired relaxation). The  average left ventricular global longitudinal strain is -19.8 %.   2. Right ventricular systolic function is normal. The right ventricular  size is normal. Tricuspid regurgitation signal is inadequate for assessing  PA pressure.   3. Left atrial size was mildly dilated.   4. A small pericardial effusion is present. There is no evidence of  cardiac tamponade.   5. The mitral valve is normal in structure. Mild to moderate mitral valve  regurgitation. No evidence of mitral stenosis.   6. The aortic valve is tricuspid. Aortic valve regurgitation is not  visualized. No aortic stenosis is present.   7. The inferior vena cava is normal in size with greater than 50%  respiratory variability, suggesting right atrial pressure of 3 mmHg.   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  History of kidney stones    Hyperlipidemia    Hypertension    Hypothyroid     Past Surgical History:  Procedure Laterality Date   BREAST REDUCTION SURGERY  04/14/2020   CARDIAC CATHETERIZATION     TONSILLECTOMY     tummy tuck  11/23/2019    MEDICATIONS:  acetaminophen (TYLENOL) 500 MG tablet   ALPRAZolam (XANAX) 0.25 MG tablet   aspirin EC 81 MG tablet   carvedilol (COREG) 3.125 MG tablet   celecoxib (CELEBREX) 200 MG capsule   Cholecalciferol (VITAMIN D3) 50 MCG (2000 UT) TABS    dapsone 25 MG tablet   FERREX 150 FORTE 150-0.025-1 MG CAPS   levothyroxine (SYNTHROID) 50 MCG tablet   lisinopril (ZESTRIL) 5 MG tablet   loperamide (IMODIUM) 1 MG/5ML solution   metroNIDAZOLE (METROGEL) 1 % gel   Multiple Vitamin (MULTI-VITAMIN DAILY PO)   Multiple Vitamins-Minerals (PRESERVISION AREDS 2) CAPS   Omega-3 Fatty Acids (FISH OIL) 1200 MG CAPS   Probiotic Product (PROBIOTIC PO)   rosuvastatin (CRESTOR) 20 MG tablet   Sulfacetamide Sodium-Sulfur 10-2 % LIQD   No current facility-administered medications for this encounter.   Marcille Blanco [facility] Surgical Short Stay/Anesthesiology [facility] Phone [phone] 05/01/2023 11:46 AM

## 2023-05-07 NOTE — H&P (Signed)
TOTAL KNEE ADMISSION H&P  Patient is being admitted for left total knee arthroplasty.  Therapy Plans: outpatient therapy EO Disposition: Home with husband Planned DVT Prophylaxis: aspirin 81mg  BID DME needed: walker, ice machine PCP: Dr. Okey Dupre Cardio: Dr. Allyson Sabal (just had echo yesterday - awaiting clearance) TXA: Allergies: codeine - nausea Anesthesia Concerns: none BMI: 34.6 Last HgbA1c:   Other: - oxycodone, robaxin, tylenol, celebrex - Takes dapsone for granuloma skin issue - No hx of VTE or cancer - SDD - SEND MEDS AHEAD  Subjective:  Chief Complaint:left knee pain.  HPI: Erin Mccann, 67 y.o. female, has a history of pain and functional disability in the left knee due to arthritis and has failed non-surgical conservative treatments for greater than 12 weeks to includeNSAID's and/or analgesics, corticosteriod injections, and activity modification.  Onset of symptoms was gradual, starting 2 years ago with gradually worsening course since that time. The patient noted no past surgery on the left knee(s).  Patient currently rates pain in the left knee(s) at 8 out of 10 with activity. Patient has worsening of pain with activity and weight bearing and pain that interferes with activities of daily living.  Patient has evidence of joint space narrowing by imaging studies.  There is no active infection.  Patient Active Problem List   Diagnosis Date Noted   Obesity 09/22/2022   Hypothyroidism 09/22/2022   Encounter for general adult medical examination with abnormal findings 09/22/2022   Diarrhea 05/17/2021   Arthritis 05/17/2021   Situational anxiety 05/17/2021   Cardiomyopathy (HCC) 02/20/2012   CAD (coronary artery disease) 02/20/2012   Hypertension 02/20/2012   Hyperlipidemia 02/20/2012   Past Medical History:  Diagnosis Date   Arthritis    CAD (coronary artery disease)    Cardiomyopathy    Cardiomyopathy    Hepatitis    in 2nd grade   History of kidney stones     Hyperlipidemia    Hypertension    Hypothyroid     Past Surgical History:  Procedure Laterality Date   BREAST REDUCTION SURGERY  04/14/2020   CARDIAC CATHETERIZATION     TONSILLECTOMY     tummy tuck  11/23/2019    No current facility-administered medications for this encounter.   Current Outpatient Medications  Medication Sig Dispense Refill Last Dose   acetaminophen (TYLENOL) 500 MG tablet Take 1,500 mg by mouth daily as needed for moderate pain.      aspirin EC 81 MG tablet Take 81 mg by mouth daily with lunch.      carvedilol (COREG) 3.125 MG tablet Take 1 tablet (3.125 mg total) by mouth 2 (two) times daily. 180 tablet 3    celecoxib (CELEBREX) 200 MG capsule Take 200 mg by mouth 2 (two) times daily.      Cholecalciferol (VITAMIN D3) 50 MCG (2000 UT) TABS Take 2,000 Units by mouth daily with lunch.      dapsone 25 MG tablet Take 25 mg by mouth 2 (two) times daily.      FERREX 150 FORTE 150-0.025-1 MG CAPS TAKE 1 CAPSULE BY MOUTH EVERY DAY (Patient taking differently: Take 1 capsule by mouth daily with lunch.) 90 capsule 3    levothyroxine (SYNTHROID) 50 MCG tablet Take 1 tablet (50 mcg total) by mouth daily. 90 tablet 3    lisinopril (ZESTRIL) 5 MG tablet Take 0.5 tablets (2.5 mg total) by mouth daily. (Patient taking differently: Take 2.5 mg by mouth daily with lunch.) 45 tablet 3    loperamide (IMODIUM) 1 MG/5ML solution Take  2-4 mg by mouth as needed for diarrhea or loose stools.      metroNIDAZOLE (METROGEL) 1 % gel Apply 1 application topically at bedtime. Apply to face for rosacea      Multiple Vitamin (MULTI-VITAMIN DAILY PO) Take 1 tablet by mouth daily with lunch.      Multiple Vitamins-Minerals (PRESERVISION AREDS 2) CAPS Take 1 capsule by mouth in the morning and at bedtime.      Omega-3 Fatty Acids (FISH OIL) 1200 MG CAPS Take 1,200 mg by mouth 2 (two) times daily.      Probiotic Product (PROBIOTIC PO) Take 1 tablet by mouth daily.      rosuvastatin (CRESTOR) 20 MG  tablet Take 1 tablet (20 mg total) by mouth daily. 90 tablet 3    Sulfacetamide Sodium-Sulfur 10-2 % LIQD Apply 1 application  topically 2 (two) times daily. Apply to face for rosacea      ALPRAZolam (XANAX) 0.25 MG tablet TAKE 1 TABLET BY MOUTH DAILY AS NEEDED FOR ANXIETY 30 tablet 0    Allergies  Allergen Reactions   Codeine Nausea Only    Social History   Tobacco Use   Smoking status: Former    Current packs/day: 0.00    Average packs/day: 0.3 packs/day for 5.0 years (1.5 ttl pk-yrs)    Types: Cigarettes    Start date: 02/20/1972    Quit date: 02/19/1977    Years since quitting: 46.2   Smokeless tobacco: Never  Substance Use Topics   Alcohol use: Yes    Alcohol/week: 2.0 standard drinks of alcohol    Types: 2 Standard drinks or equivalent per week    Comment: Occasional    Family History  Problem Relation Age of Onset   Hypertension Mother    Heart disease Father        CHF     Review of Systems  Constitutional:  Negative for chills and fever.  Respiratory:  Negative for cough and shortness of breath.   Cardiovascular:  Negative for chest pain.  Gastrointestinal:  Negative for nausea and vomiting.  Musculoskeletal:  Positive for arthralgias.     Objective:  Physical Exam Well nourished and well developed. General: Alert and oriented x3, cooperative and pleasant, no acute distress. Head: normocephalic, atraumatic, neck supple. Eyes: EOMI.  Musculoskeletal: Bilateral knee exams: No palpable effusions, warmth erythema Bilateral genu varum Slight flexion contractures with flexion close to 120 degrees with tightness of the anterior medial aspect the knees left greater than right currently No significant lower extremity edema, erythema or calf tenderness   Calves soft and nontender. Motor function intact in LE. Strength 5/5 LE bilaterally. Neuro: Distal pulses 2+. Sensation to light touch intact in LE.  Vital signs in last 24 hours:    Labs:   Estimated  body mass index is 32.77 kg/m as calculated from the following:   Height as of 04/25/23: 5\' 6"  (1.676 m).   Weight as of 04/25/23: 92.1 kg.   Imaging Review Plain radiographs demonstrate severe degenerative joint disease of the left knee(s). The overall alignment isneutral. The bone quality appears to be adequate for age and reported activity level.      Assessment/Plan:  End stage arthritis, left knee   The patient history, physical examination, clinical judgment of the provider and imaging studies are consistent with end stage degenerative joint disease of the left knee(s) and total knee arthroplasty is deemed medically necessary. The treatment options including medical management, injection therapy arthroscopy and arthroplasty were discussed at  length. The risks and benefits of total knee arthroplasty were presented and reviewed. The risks due to aseptic loosening, infection, stiffness, patella tracking problems, thromboembolic complications and other imponderables were discussed. The patient acknowledged the explanation, agreed to proceed with the plan and consent was signed. Patient is being admitted for inpatient treatment for surgery, pain control, PT, OT, prophylactic antibiotics, VTE prophylaxis, progressive ambulation and ADL's and discharge planning. The patient is planning to be discharged  home.     Patient's anticipated LOS is less than 2 midnights, meeting these requirements: - Younger than 49 - Lives within 1 hour of care - Has a competent adult at home to recover with post-op recover - NO history of  - Chronic pain requiring opiods  - Diabetes  - Coronary Artery Disease  - Heart failure  - Heart attack  - Stroke  - DVT/VTE  - Cardiac arrhythmia  - Respiratory Failure/COPD  - Renal failure  - Anemia  - Advanced Liver disease  Rosalene Billings, PA-C Orthopedic Surgery [facility] [place] [phone]

## 2023-05-08 ENCOUNTER — Observation Stay (HOSPITAL_COMMUNITY)
Admission: RE | Admit: 2023-05-08 | Discharge: 2023-05-09 | Disposition: A | Discharge status: Home / Self Care | Payer: Medicare HMO | Attending: Orthopedic Surgery | Admitting: Orthopedic Surgery

## 2023-05-08 ENCOUNTER — Ambulatory Visit (HOSPITAL_COMMUNITY): Payer: Medicare HMO | Admitting: Medical

## 2023-05-08 ENCOUNTER — Ambulatory Visit (HOSPITAL_BASED_OUTPATIENT_CLINIC_OR_DEPARTMENT_OTHER): Payer: Medicare HMO | Admitting: Anesthesiology

## 2023-05-08 ENCOUNTER — Other Ambulatory Visit: Payer: Self-pay

## 2023-05-08 ENCOUNTER — Encounter (HOSPITAL_COMMUNITY)
Admission: RE | Disposition: A | Discharge status: Home / Self Care | Payer: Self-pay | Source: Home / Self Care | Attending: Orthopedic Surgery

## 2023-05-08 ENCOUNTER — Encounter (HOSPITAL_COMMUNITY): Payer: Self-pay | Admitting: Orthopedic Surgery

## 2023-05-08 DIAGNOSIS — Z96652 Presence of left artificial knee joint: Principal | ICD-10-CM

## 2023-05-08 DIAGNOSIS — E039 Hypothyroidism, unspecified: Secondary | ICD-10-CM | POA: Diagnosis not present

## 2023-05-08 DIAGNOSIS — I10 Essential (primary) hypertension: Secondary | ICD-10-CM | POA: Diagnosis not present

## 2023-05-08 DIAGNOSIS — Z87891 Personal history of nicotine dependence: Secondary | ICD-10-CM | POA: Diagnosis not present

## 2023-05-08 DIAGNOSIS — M1712 Unilateral primary osteoarthritis, left knee: Principal | ICD-10-CM | POA: Insufficient documentation

## 2023-05-08 DIAGNOSIS — Z79899 Other long term (current) drug therapy: Secondary | ICD-10-CM | POA: Diagnosis not present

## 2023-05-08 DIAGNOSIS — Z7982 Long term (current) use of aspirin: Secondary | ICD-10-CM | POA: Insufficient documentation

## 2023-05-08 DIAGNOSIS — I2510 Atherosclerotic heart disease of native coronary artery without angina pectoris: Secondary | ICD-10-CM | POA: Insufficient documentation

## 2023-05-08 DIAGNOSIS — G8918 Other acute postprocedural pain: Secondary | ICD-10-CM | POA: Diagnosis not present

## 2023-05-08 HISTORY — PX: TOTAL KNEE ARTHROPLASTY: SHX125

## 2023-05-08 SURGERY — ARTHROPLASTY, KNEE, TOTAL
Anesthesia: Spinal | Site: Knee | Laterality: Left

## 2023-05-08 MED ORDER — BISACODYL 10 MG RE SUPP: 10.0000 mg | Freq: Every day | RECTAL | Status: DC | PRN

## 2023-05-08 MED ORDER — METHOCARBAMOL 500 MG IVPB - SIMPLE MED: 500.0000 mg | Freq: Four times a day (QID) | INTRAVENOUS | Status: DC | PRN

## 2023-05-08 MED ORDER — ONDANSETRON HCL 4 MG PO TABS: 4.0000 mg | ORAL_TABLET | Freq: Four times a day (QID) | ORAL | Status: DC | PRN

## 2023-05-08 MED ORDER — ONDANSETRON HCL 4 MG/2ML IJ SOLN
INTRAMUSCULAR | Status: AC
  Filled 2023-05-08: qty 2

## 2023-05-08 MED ORDER — BUPIVACAINE-EPINEPHRINE (PF) 0.25% -1:200000 IJ SOLN
INTRAMUSCULAR | Status: DC | PRN
  Administered 2023-05-08: 30 mL

## 2023-05-08 MED ORDER — TRANEXAMIC ACID-NACL 1000-0.7 MG/100ML-% IV SOLN
1000.0000 mg | Freq: Once | INTRAVENOUS | Status: AC
  Administered 2023-05-08: 1000 mg via INTRAVENOUS
  Filled 2023-05-08: qty 100

## 2023-05-08 MED ORDER — METHOCARBAMOL 500 MG IVPB - SIMPLE MED
INTRAVENOUS | Status: AC
  Administered 2023-05-08: 500 mg via INTRAVENOUS
  Filled 2023-05-08: qty 55

## 2023-05-08 MED ORDER — HYDROMORPHONE HCL 1 MG/ML IJ SOLN
0.5000 mg | INTRAMUSCULAR | Status: DC | PRN
  Administered 2023-05-08: 1 mg via INTRAVENOUS
  Filled 2023-05-08: qty 1

## 2023-05-08 MED ORDER — PHENOL 1.4 % MT LIQD: 1.0000 | OROMUCOSAL | Status: DC | PRN

## 2023-05-08 MED ORDER — CEFAZOLIN SODIUM-DEXTROSE 2-4 GM/100ML-% IV SOLN
2.0000 g | Freq: Four times a day (QID) | INTRAVENOUS | Status: AC
  Administered 2023-05-08 (×2): 2 g via INTRAVENOUS
  Filled 2023-05-08 (×2): qty 100

## 2023-05-08 MED ORDER — ROSUVASTATIN CALCIUM 20 MG PO TABS
20.0000 mg | ORAL_TABLET | Freq: Every day | ORAL | Status: DC
  Administered 2023-05-09: 20 mg via ORAL
  Filled 2023-05-08: qty 1

## 2023-05-08 MED ORDER — SODIUM CHLORIDE 0.9 % IV SOLN: INTRAVENOUS | Status: DC

## 2023-05-08 MED ORDER — POLYSACCHARIDE IRON COMPLEX 150 MG PO CAPS: 150.0000 mg | ORAL_CAPSULE | Freq: Every day | ORAL | Status: DC

## 2023-05-08 MED ORDER — KETOROLAC TROMETHAMINE 30 MG/ML IJ SOLN
INTRAMUSCULAR | Status: AC
  Filled 2023-05-08: qty 1

## 2023-05-08 MED ORDER — PHENYLEPHRINE HCL-NACL 20-0.9 MG/250ML-% IV SOLN
INTRAVENOUS | Status: AC
  Filled 2023-05-08: qty 250

## 2023-05-08 MED ORDER — DAPSONE 25 MG PO TABS
25.0000 mg | ORAL_TABLET | Freq: Two times a day (BID) | ORAL | Status: DC
  Administered 2023-05-08 – 2023-05-09 (×2): 25 mg via ORAL
  Filled 2023-05-08 (×3): qty 1

## 2023-05-08 MED ORDER — OXYCODONE HCL 5 MG PO TABS
5.0000 mg | ORAL_TABLET | ORAL | Status: DC | PRN
  Filled 2023-05-08: qty 2

## 2023-05-08 MED ORDER — POLYETHYLENE GLYCOL 3350 17 G PO PACK
17.0000 g | PACK | Freq: Two times a day (BID) | ORAL | Status: DC
  Filled 2023-05-08 (×2): qty 1

## 2023-05-08 MED ORDER — DOCUSATE SODIUM 100 MG PO CAPS
100.0000 mg | ORAL_CAPSULE | Freq: Two times a day (BID) | ORAL | Status: DC
  Administered 2023-05-08 – 2023-05-09 (×2): 100 mg via ORAL
  Filled 2023-05-08 (×2): qty 1

## 2023-05-08 MED ORDER — CELECOXIB 200 MG PO CAPS
200.0000 mg | ORAL_CAPSULE | Freq: Two times a day (BID) | ORAL | Status: DC
  Administered 2023-05-08 – 2023-05-09 (×3): 200 mg via ORAL
  Filled 2023-05-08 (×3): qty 1

## 2023-05-08 MED ORDER — FOLIC ACID 1 MG PO TABS
1.0000 mg | ORAL_TABLET | Freq: Every day | ORAL | Status: DC
  Administered 2023-05-09: 1 mg via ORAL
  Filled 2023-05-08: qty 1

## 2023-05-08 MED ORDER — BUPIVACAINE IN DEXTROSE 0.75-8.25 % IT SOLN
INTRATHECAL | Status: DC | PRN
  Administered 2023-05-08: 1.6 mL via INTRATHECAL

## 2023-05-08 MED ORDER — SODIUM CHLORIDE 0.9 % IR SOLN
Status: DC | PRN
  Administered 2023-05-08: 1000 mL

## 2023-05-08 MED ORDER — POVIDONE-IODINE 10 % EX SWAB: 2.0000 | Freq: Once | CUTANEOUS | Status: DC

## 2023-05-08 MED ORDER — DEXAMETHASONE SODIUM PHOSPHATE 10 MG/ML IJ SOLN
INTRAMUSCULAR | Status: AC
  Filled 2023-05-08: qty 1

## 2023-05-08 MED ORDER — ROPIVACAINE HCL 7.5 MG/ML IJ SOLN
INTRAMUSCULAR | Status: DC | PRN
Start: 2023-05-08 — End: 2023-05-08
  Administered 2023-05-08: 20 mL via PERINEURAL

## 2023-05-08 MED ORDER — ACETAMINOPHEN 500 MG PO TABS
1000.0000 mg | ORAL_TABLET | Freq: Four times a day (QID) | ORAL | Status: AC
  Administered 2023-05-08 – 2023-05-09 (×3): 1000 mg via ORAL
  Filled 2023-05-08 (×3): qty 2

## 2023-05-08 MED ORDER — MENTHOL 3 MG MT LOZG: 1.0000 | LOZENGE | OROMUCOSAL | Status: DC | PRN

## 2023-05-08 MED ORDER — HYDROMORPHONE HCL 1 MG/ML IJ SOLN
0.2500 mg | INTRAMUSCULAR | Status: DC | PRN
  Administered 2023-05-08: 0.25 mg via INTRAVENOUS

## 2023-05-08 MED ORDER — DIPHENHYDRAMINE HCL 12.5 MG/5ML PO ELIX: 12.5000 mg | ORAL_SOLUTION | ORAL | Status: DC | PRN

## 2023-05-08 MED ORDER — ASPIRIN 81 MG PO CHEW
81.0000 mg | CHEWABLE_TABLET | Freq: Two times a day (BID) | ORAL | Status: DC
  Administered 2023-05-08 – 2023-05-09 (×2): 81 mg via ORAL
  Filled 2023-05-08 (×2): qty 1

## 2023-05-08 MED ORDER — PROPOFOL 500 MG/50ML IV EMUL
INTRAVENOUS | Status: AC
  Filled 2023-05-08: qty 50

## 2023-05-08 MED ORDER — SODIUM CHLORIDE (PF) 0.9 % IJ SOLN
INTRAMUSCULAR | Status: DC | PRN
  Administered 2023-05-08: 30 mL

## 2023-05-08 MED ORDER — OXYCODONE HCL 5 MG PO TABS
10.0000 mg | ORAL_TABLET | ORAL | Status: DC | PRN
  Administered 2023-05-08: 10 mg via ORAL
  Administered 2023-05-09 (×3): 15 mg via ORAL
  Filled 2023-05-08 (×3): qty 3

## 2023-05-08 MED ORDER — METOCLOPRAMIDE HCL 5 MG/ML IJ SOLN
5.0000 mg | Freq: Three times a day (TID) | INTRAMUSCULAR | Status: DC | PRN
  Administered 2023-05-08: 10 mg via INTRAVENOUS
  Filled 2023-05-08: qty 2

## 2023-05-08 MED ORDER — MIDAZOLAM HCL 2 MG/2ML IJ SOLN
INTRAMUSCULAR | Status: AC
  Filled 2023-05-08: qty 2

## 2023-05-08 MED ORDER — CHLORHEXIDINE GLUCONATE 0.12 % MT SOLN
15.0000 mL | Freq: Once | OROMUCOSAL | Status: AC
  Administered 2023-05-08: 15 mL via OROMUCOSAL

## 2023-05-08 MED ORDER — VITAMIN B-12 100 MCG PO TABS
200.0000 ug | ORAL_TABLET | Freq: Every day | ORAL | Status: DC
  Administered 2023-05-09: 200 ug via ORAL
  Filled 2023-05-08: qty 2

## 2023-05-08 MED ORDER — MEPERIDINE HCL 50 MG/ML IJ SOLN
INTRAMUSCULAR | Status: AC
  Administered 2023-05-08: 12.5 mg via INTRAVENOUS
  Filled 2023-05-08: qty 1

## 2023-05-08 MED ORDER — OXYCODONE HCL 5 MG PO TABS: 5.0000 mg | ORAL_TABLET | Freq: Once | ORAL | Status: AC | PRN

## 2023-05-08 MED ORDER — FENTANYL CITRATE (PF) 100 MCG/2ML IJ SOLN
INTRAMUSCULAR | Status: AC
  Filled 2023-05-08: qty 2

## 2023-05-08 MED ORDER — LACTATED RINGERS IV SOLN: INTRAVENOUS | Status: DC

## 2023-05-08 MED ORDER — BUPIVACAINE-EPINEPHRINE 0.25% -1:200000 IJ SOLN
INTRAMUSCULAR | Status: AC
  Filled 2023-05-08: qty 1

## 2023-05-08 MED ORDER — FENTANYL CITRATE (PF) 100 MCG/2ML IJ SOLN
INTRAMUSCULAR | Status: DC | PRN
  Administered 2023-05-08: 100 ug via INTRAVENOUS

## 2023-05-08 MED ORDER — ONDANSETRON HCL 4 MG/2ML IJ SOLN
4.0000 mg | Freq: Once | INTRAMUSCULAR | Status: AC | PRN
  Administered 2023-05-08: 4 mg via INTRAVENOUS

## 2023-05-08 MED ORDER — IRON POLYSACCH CMPLX-B12-FA 150-0.025-1 MG PO CAPS: 1.0000 | ORAL_CAPSULE | Freq: Every day | ORAL | Status: DC

## 2023-05-08 MED ORDER — OXYCODONE HCL 5 MG/5ML PO SOLN: 5.0000 mg | Freq: Once | ORAL | Status: AC | PRN

## 2023-05-08 MED ORDER — TRANEXAMIC ACID-NACL 1000-0.7 MG/100ML-% IV SOLN
1000.0000 mg | INTRAVENOUS | Status: AC
  Administered 2023-05-08: 1000 mg via INTRAVENOUS
  Filled 2023-05-08: qty 100

## 2023-05-08 MED ORDER — STERILE WATER FOR IRRIGATION IR SOLN
Status: DC | PRN
  Administered 2023-05-08: 2000 mL

## 2023-05-08 MED ORDER — CEFAZOLIN SODIUM-DEXTROSE 2-4 GM/100ML-% IV SOLN
2.0000 g | INTRAVENOUS | Status: AC
  Administered 2023-05-08: 2 g via INTRAVENOUS
  Filled 2023-05-08: qty 100

## 2023-05-08 MED ORDER — DEXAMETHASONE SODIUM PHOSPHATE 10 MG/ML IJ SOLN
8.0000 mg | Freq: Once | INTRAMUSCULAR | Status: AC
  Administered 2023-05-08: 8 mg via INTRAVENOUS

## 2023-05-08 MED ORDER — ACETAMINOPHEN 325 MG PO TABS: 325.0000 mg | ORAL_TABLET | Freq: Four times a day (QID) | ORAL | Status: DC | PRN

## 2023-05-08 MED ORDER — ALPRAZOLAM 0.25 MG PO TABS: 0.2500 mg | ORAL_TABLET | Freq: Every day | ORAL | Status: DC | PRN

## 2023-05-08 MED ORDER — DEXAMETHASONE SODIUM PHOSPHATE 10 MG/ML IJ SOLN
10.0000 mg | Freq: Once | INTRAMUSCULAR | Status: AC
  Administered 2023-05-09: 10 mg via INTRAVENOUS
  Filled 2023-05-08: qty 1

## 2023-05-08 MED ORDER — PROPOFOL 500 MG/50ML IV EMUL
INTRAVENOUS | Status: DC | PRN
  Administered 2023-05-08: 80 ug/kg/min via INTRAVENOUS

## 2023-05-08 MED ORDER — PROPOFOL 10 MG/ML IV BOLUS
INTRAVENOUS | Status: DC | PRN
Start: 2023-05-08 — End: 2023-05-08
  Administered 2023-05-08 (×3): 20 mg via INTRAVENOUS

## 2023-05-08 MED ORDER — METHOCARBAMOL 500 MG PO TABS
500.0000 mg | ORAL_TABLET | Freq: Four times a day (QID) | ORAL | Status: DC | PRN
  Administered 2023-05-08: 500 mg via ORAL
  Filled 2023-05-08: qty 1

## 2023-05-08 MED ORDER — OXYCODONE HCL 5 MG PO TABS
ORAL_TABLET | ORAL | Status: AC
  Administered 2023-05-08: 5 mg via ORAL
  Filled 2023-05-08: qty 1

## 2023-05-08 MED ORDER — CARVEDILOL 3.125 MG PO TABS
3.1250 mg | ORAL_TABLET | Freq: Two times a day (BID) | ORAL | Status: DC
  Administered 2023-05-08 – 2023-05-09 (×2): 3.125 mg via ORAL
  Filled 2023-05-08 (×2): qty 1

## 2023-05-08 MED ORDER — ORAL CARE MOUTH RINSE: 15.0000 mL | Freq: Once | OROMUCOSAL | Status: AC

## 2023-05-08 MED ORDER — SODIUM CHLORIDE (PF) 0.9 % IJ SOLN
INTRAMUSCULAR | Status: AC
  Filled 2023-05-08: qty 30

## 2023-05-08 MED ORDER — LEVOTHYROXINE SODIUM 50 MCG PO TABS
50.0000 ug | ORAL_TABLET | Freq: Every day | ORAL | Status: DC
  Administered 2023-05-09: 50 ug via ORAL
  Filled 2023-05-08: qty 1

## 2023-05-08 MED ORDER — ONDANSETRON HCL 4 MG/2ML IJ SOLN
4.0000 mg | Freq: Four times a day (QID) | INTRAMUSCULAR | Status: DC | PRN
  Administered 2023-05-09: 4 mg via INTRAVENOUS
  Filled 2023-05-08: qty 2

## 2023-05-08 MED ORDER — PROPOFOL 1000 MG/100ML IV EMUL
INTRAVENOUS | Status: AC
  Filled 2023-05-08: qty 100

## 2023-05-08 MED ORDER — KETOROLAC TROMETHAMINE 30 MG/ML IJ SOLN
INTRAMUSCULAR | Status: DC | PRN
  Administered 2023-05-08: 30 mg

## 2023-05-08 MED ORDER — MEPERIDINE HCL 50 MG/ML IJ SOLN: 6.2500 mg | INTRAMUSCULAR | Status: DC | PRN

## 2023-05-08 MED ORDER — METOCLOPRAMIDE HCL 5 MG PO TABS: 5.0000 mg | ORAL_TABLET | Freq: Three times a day (TID) | ORAL | Status: DC | PRN

## 2023-05-08 MED ORDER — 0.9 % SODIUM CHLORIDE (POUR BTL) OPTIME
TOPICAL | Status: DC | PRN
  Administered 2023-05-08: 1000 mL

## 2023-05-08 MED ORDER — MIDAZOLAM HCL 5 MG/5ML IJ SOLN
INTRAMUSCULAR | Status: DC | PRN
  Administered 2023-05-08: 2 mg via INTRAVENOUS

## 2023-05-08 MED ORDER — HYDROMORPHONE HCL 1 MG/ML IJ SOLN
INTRAMUSCULAR | Status: AC
  Administered 2023-05-08: 0.25 mg via INTRAVENOUS
  Filled 2023-05-08: qty 1

## 2023-05-08 SURGICAL SUPPLY — 60 items
ADH SKN CLS APL DERMABOND .7 (GAUZE/BANDAGES/DRESSINGS) ×1
ADH SKN CLS LQ APL DERMABOND (GAUZE/BANDAGES/DRESSINGS) ×1
ATTUNE MED ANAT PAT 32 KNEE (Knees) IMPLANT
BAG COUNTER SPONGE SURGICOUNT (BAG) IMPLANT
BAG SPEC THK2 15X12 ZIP CLS (MISCELLANEOUS)
BAG SPNG CNTER NS LX DISP (BAG)
BAG ZIPLOCK 12X15 (MISCELLANEOUS) IMPLANT
BASEPLATE TIB CMT FB PCKT SZ3 (Knees) IMPLANT
BLADE SAW SGTL 11.0X1.19X90.0M (BLADE) IMPLANT
BLADE SAW SGTL 13.0X1.19X90.0M (BLADE) ×1 IMPLANT
BNDG CMPR 6 X 5 YARDS HK CLSR (GAUZE/BANDAGES/DRESSINGS) ×1
BNDG CMPR MED 10X6 ELC LF (GAUZE/BANDAGES/DRESSINGS) ×1
BNDG ELASTIC 6INX 5YD STR LF (GAUZE/BANDAGES/DRESSINGS) ×1 IMPLANT
BNDG ELASTIC 6X10 VLCR STRL LF (GAUZE/BANDAGES/DRESSINGS) IMPLANT
BOWL SMART MIX CTS (DISPOSABLE) ×1 IMPLANT
BSPLAT TIB 3 CMNT FXBRNG STRL (Knees) ×1 IMPLANT
CEMENT HV SMART SET (Cement) IMPLANT
COMP FEM CMT ATTUNE NRS 4 LT (Joint) ×1 IMPLANT
COMPONENT FEM CMT ATTN NRS 4LT (Joint) IMPLANT
COVER SURGICAL LIGHT HANDLE (MISCELLANEOUS) ×1 IMPLANT
CUFF TOURN SGL QUICK 34 (TOURNIQUET CUFF) ×1
CUFF TRNQT CYL 34X4.125X (TOURNIQUET CUFF) ×1 IMPLANT
DERMABOND ADVANCED .7 DNX12 (GAUZE/BANDAGES/DRESSINGS) ×1 IMPLANT
DERMABOND ADVANCED .7 DNX6 (GAUZE/BANDAGES/DRESSINGS) IMPLANT
DRAPE U-SHAPE 47X51 STRL (DRAPES) ×1 IMPLANT
DRESSING AQUACEL AG SP 3.5X10 (GAUZE/BANDAGES/DRESSINGS) ×1 IMPLANT
DRSG AQUACEL AG ADV 3.5X10 (GAUZE/BANDAGES/DRESSINGS) IMPLANT
DRSG AQUACEL AG SP 3.5X10 (GAUZE/BANDAGES/DRESSINGS) ×1
DURAPREP 26ML APPLICATOR (WOUND CARE) ×2 IMPLANT
ELECT REM PT RETURN 15FT ADLT (MISCELLANEOUS) ×1 IMPLANT
GLOVE BIO SURGEON STRL SZ 6 (GLOVE) ×1 IMPLANT
GLOVE BIOGEL PI IND STRL 6.5 (GLOVE) ×1 IMPLANT
GLOVE BIOGEL PI IND STRL 7.5 (GLOVE) ×1 IMPLANT
GLOVE ORTHO TXT STRL SZ7.5 (GLOVE) ×2 IMPLANT
GOWN STRL REUS W/ TWL LRG LVL3 (GOWN DISPOSABLE) ×2 IMPLANT
GOWN STRL REUS W/TWL LRG LVL3 (GOWN DISPOSABLE) ×2
HANDPIECE INTERPULSE COAX TIP (DISPOSABLE) ×1
HOLDER FOLEY CATH W/STRAP (MISCELLANEOUS) IMPLANT
INSERT MED ATTUNE KNEE 4X6 LT (Insert) IMPLANT
KIT TURNOVER KIT A (KITS) IMPLANT
MANIFOLD NEPTUNE II (INSTRUMENTS) ×1 IMPLANT
NDL SAFETY ECLIP 18X1.5 (MISCELLANEOUS) IMPLANT
NS IRRIG 1000ML POUR BTL (IV SOLUTION) ×1 IMPLANT
PACK TOTAL KNEE CUSTOM (KITS) ×1 IMPLANT
PIN FIX SIGMA LCS THRD HI (PIN) IMPLANT
PROTECTOR NERVE ULNAR (MISCELLANEOUS) ×1 IMPLANT
SET HNDPC FAN SPRY TIP SCT (DISPOSABLE) ×1 IMPLANT
SET PAD KNEE POSITIONER (MISCELLANEOUS) ×1 IMPLANT
SPIKE FLUID TRANSFER (MISCELLANEOUS) ×2 IMPLANT
SUT MNCRL AB 4-0 PS2 18 (SUTURE) ×1 IMPLANT
SUT STRATAFIX PDS+ 0 24IN (SUTURE) ×1 IMPLANT
SUT VIC AB 1 CT1 36 (SUTURE) ×1 IMPLANT
SUT VIC AB 2-0 CT1 27 (SUTURE) ×2
SUT VIC AB 2-0 CT1 TAPERPNT 27 (SUTURE) ×2 IMPLANT
SYR 3ML LL SCALE MARK (SYRINGE) ×1 IMPLANT
TOWEL GREEN STERILE FF (TOWEL DISPOSABLE) ×1 IMPLANT
TRAY FOLEY MTR SLVR 16FR STAT (SET/KITS/TRAYS/PACK) ×1 IMPLANT
TUBE SUCTION HIGH CAP CLEAR NV (SUCTIONS) ×1 IMPLANT
WATER STERILE IRR 1000ML POUR (IV SOLUTION) ×2 IMPLANT
WRAP KNEE MAXI GEL POST OP (GAUZE/BANDAGES/DRESSINGS) ×1 IMPLANT

## 2023-05-08 NOTE — Transfer of Care (Signed)
Immediate Anesthesia Transfer of Care Note  Patient: Erin Mccann  Procedure(s) Performed: TOTAL KNEE ARTHROPLASTY (Left: Knee)  Patient Location: PACU  Anesthesia Type:Spinal  Level of Consciousness: sedated  Airway & Oxygen Therapy: Patient Spontanous Breathing and Patient connected to face mask oxygen  Post-op Assessment: Report given to RN and Post -op Vital signs reviewed and stable  Post vital signs: Reviewed and stable  Last Vitals:  Vitals Value Taken Time  BP 123/50 05/08/23 0900  Temp    Pulse 86 05/08/23 0901  Resp 13 05/08/23 0901  SpO2 100 % 05/08/23 0901  Vitals shown include unfiled device data.  Last Pain:  Vitals:   05/08/23 0617  TempSrc: Oral         Complications: No notable events documented.

## 2023-05-08 NOTE — Evaluation (Addendum)
Physical Therapy Evaluation Patient Details Name: Erin Mccann MRN: [id number] DOB: June 03, 1956 Today's Date: 05/08/2023  History of Present Illness  67 yo female presents to therapy s/p L TKA on 05/08/2023 due to failure of conservative measures. Pt PMH includes but is not limited to: hypothyroidism, anxiety, cardiomyopathy, CAD< HTN, HLD, CAD, hepatitis, and kidney stones.  Clinical Impression     Erin Mccann is a 67 y.o. female POD 0 s/p L TKA. Patient reports IND with mobility at baseline. Patient is now limited by functional impairments (see PT problem list below) and requires min guard for bed mobility and min guard for transfers. Patient was unable to safely ambulate at time of eval. Evaluation limited due to reports of nausea and increased pain once seated EOB in addition to pt reporting feeling very sleepy. PT assessed BP in sitting and in standing, see below and indicative of orthostatic hypotension. Nursing aware and provided medication to address nausea and pain during eval. Pt assisted to recliner with SPT and min guard with use of RW and cues. Pt left seated in recliner, all needs in place and family present. Patient will benefit from continued skilled PT interventions to address impairments and progress towards PLOF. Acute PT will follow to progress mobility and stair training in preparation for safe discharge home with family support and OPPT services.   Bp seated EOB 130/67 (55 PR) Bp standing 102/48 (69 PR)    If plan is discharge home, recommend the following: A lot of help with walking and/or transfers;A little help with bathing/dressing/bathroom;Assistance with cooking/housework;Assist for transportation;Help with stairs or ramp for entrance   Can travel by private vehicle        Equipment Recommendations Rolling walker (2 wheels)  Recommendations for Other Services       Functional Status Assessment Patient has had a recent decline in their functional status and  demonstrates the ability to make significant improvements in function in a reasonable and predictable amount of time.     Precautions / Restrictions Precautions Precautions: Knee;Fall (orthostatic) Restrictions Weight Bearing Restrictions: No      Mobility  Bed Mobility Overal bed mobility: Needs Assistance Bed Mobility: Supine to Sit     Supine to sit: Min guard, HOB elevated     General bed mobility comments: increased time due to reports of nausea, cues    Transfers Overall transfer level: Needs assistance Equipment used: Rolling walker (2 wheels) Transfers: Sit to/from Stand, Bed to chair/wheelchair/BSC Sit to Stand: Min guard Stand pivot transfers: Min guard, From elevated surface         General transfer comment: cues for safety, UE and AD placement    Ambulation/Gait               General Gait Details: NT due to reports of nausea, increased pain and orthostatic hypotension  Stairs            Wheelchair Mobility     Tilt Bed    Modified Rankin (Stroke Patients Only)       Balance Overall balance assessment: Needs assistance Sitting-balance support: Feet supported Sitting balance-Leahy Scale: Good     Standing balance support: Bilateral upper extremity supported, During functional activity, Reliant on assistive device for balance Standing balance-Leahy Scale: Poor                               Pertinent Vitals/Pain Pain Assessment Pain Assessment: 0-10 Pain Score: 6  Pain  Location: L leg/knee Pain Descriptors / Indicators: Aching, Constant, Discomfort, Operative site guarding Pain Intervention(s): Limited activity within patient's tolerance, Monitored during session, Premedicated before session, Repositioned, Patient requesting pain meds-RN notified, RN gave pain meds during session, Ice applied    Home Living Family/patient expects to be discharged to:: Private residence Living Arrangements: Spouse/significant  other Available Help at Discharge: Family Type of Home: House Home Access: Stairs to enter Entrance Stairs-Rails: None Entrance Stairs-Number of Steps: 2   Home Layout: Multi-level;Able to live on main level with bedroom/bathroom Home Equipment: None      Prior Function Prior Level of Function : Independent/Modified Independent;Driving             Mobility Comments: IND with all ADLs, self care task and driving ADLs Comments: pt reports husband assisted pt up/down steps due to no handrail PLOF     Hand Dominance        Extremity/Trunk Assessment        Lower Extremity Assessment Lower Extremity Assessment: LLE deficits/detail LLE Deficits / Details: ankle DF/PF 5/5; SLR AA LLE Sensation: WNL    Cervical / Trunk Assessment Cervical / Trunk Assessment:  (wfl)  Communication   Communication: No difficulties  Cognition Arousal/Alertness: Awake/alert Behavior During Therapy: WFL for tasks assessed/performed Overall Cognitive Status: Within Functional Limits for tasks assessed                                          General Comments      Exercises     Assessment/Plan    PT Assessment Patient needs continued PT services  PT Problem List Decreased strength;Decreased range of motion;Decreased activity tolerance;Decreased balance;Decreased mobility;Decreased coordination;Pain       PT Treatment Interventions DME instruction;Gait training;Stair training;Functional mobility training;Therapeutic activities;Therapeutic exercise;Neuromuscular re-education;Balance training;Patient/family education;Modalities    PT Goals (Current goals can be found in the Care Plan section)  Acute Rehab PT Goals Patient Stated Goal: to be able to ambulate on uneven surfaces on PT Goal Formulation: With patient Time For Goal Achievement: 05/29/23 Potential to Achieve Goals: Good    Frequency 7X/week     Co-evaluation               AM-PAC PT "6 Clicks"  Mobility  Outcome Measure Help needed turning from your back to your side while in a flat bed without using bedrails?: A Little Help needed moving from lying on your back to sitting on the side of a flat bed without using bedrails?: A Little Help needed moving to and from a bed to a chair (including a wheelchair)?: A Little Help needed standing up from a chair using your arms (e.g., wheelchair or bedside chair)?: A Little Help needed to walk in hospital room?: Total Help needed climbing 3-5 steps with a railing? : Total 6 Click Score: 14    End of Session Equipment Utilized During Treatment: Gait belt Activity Tolerance: Treatment limited secondary to medical complications (Comment);Patient limited by pain (orthostatic hypotension and reports of nausea and sleepiness) Patient left: in chair;with call bell/phone within reach;with family/visitor present Nurse Communication: Mobility status;Other (comment);Patient requests pain meds (Bp findings) PT Visit Diagnosis: Unsteadiness on feet (R26.81);Other abnormalities of gait and mobility (R26.89);Muscle weakness (generalized) (M62.81);Difficulty in walking, not elsewhere classified (R26.2);Pain Pain - Right/Left: Left Pain - part of body: Knee;Leg    Time: 0981-1914 PT Time Calculation (min) (ACUTE ONLY): 26 min  Charges:   PT Evaluation $PT Eval Low Complexity: 1 Low PT Treatments $Therapeutic Activity: 8-22 mins PT General Charges $$ ACUTE PT VISIT: 1 Visit         Johnny Bridge, PT Acute Rehab   Jacqualyn Posey 05/08/2023, 4:17 PM

## 2023-05-08 NOTE — Anesthesia Procedure Notes (Signed)
Spinal  Patient location during procedure: OR Start time: 05/08/2023 7:22 AM End time: 05/08/2023 7:25 AM Reason for block: surgical anesthesia Staffing Performed: anesthesiologist  Anesthesiologist: Eilene Ghazi, MD Performed by: Eilene Ghazi, MD Authorized by: Eilene Ghazi, MD   Preanesthetic Checklist Completed: patient identified, IV checked, site marked, risks and benefits discussed, surgical consent, monitors and equipment checked, pre-op evaluation and timeout performed Spinal Block Patient position: sitting Prep: Betadine Patient monitoring: heart rate, continuous pulse ox and blood pressure Approach: midline Location: L3-4 Injection technique: single-shot Needle Needle type: Sprotte  Needle gauge: 24 G Needle length: 9 cm Assessment Sensory level: T6 Events: CSF return Additional Notes

## 2023-05-08 NOTE — Discharge Instructions (Signed)

## 2023-05-08 NOTE — Op Note (Signed)
NAME:  Erin Mccann                      MEDICAL RECORD NO.:  [id number]                             FACILITY:  [facility]      PHYSICIAN:  Madlyn Frankel. Charlann Boxer, M.D.  DATE OF BIRTH:  [birth year]      DATE OF PROCEDURE:  05/08/2023                                     OPERATIVE REPORT         PREOPERATIVE DIAGNOSIS:  Left knee osteoarthritis.      POSTOPERATIVE DIAGNOSIS:  Left knee osteoarthritis.      FINDINGS:  The patient was noted to have complete loss of cartilage and   bone-on-bone arthritis with associated osteophytes in the medial and patellofemoral compartments of   the knee with a significant synovitis and associated effusion.  The patient had failed months of conservative treatment including medications, injection therapy, activity modification.     PROCEDURE:  Left total knee replacement.      COMPONENTS USED:  DePuy Attune FB CR MS knee   system, a size 4N femur, 3 tibia, size 6 mm CR MD AOX insert, and 32 anatomic patellar   button.      SURGEON:  Madlyn Frankel. Charlann Boxer, M.D.      ASSISTANT:  Rosalene Billings, PA-C.      ANESTHESIA:  Regional and Spinal.      SPECIMENS:  None.      COMPLICATION:  None.      DRAINS:  None.  EBL: <100 cc      TOURNIQUET TIME:   Total Tourniquet Time Documented: Thigh (Left) - 27 minutes Total: Thigh (Left) - 27 minutes  .      The patient was stable to the recovery room.      INDICATION FOR PROCEDURE:  Erin Mccann is a 67 y.o. female patient of   mine.  The patient had been seen, evaluated, and treated for months conservatively in the   office with medication, activity modification, and injections.  The patient had   radiographic changes of bone-on-bone arthritis with endplate sclerosis and osteophytes noted.  Based on the radiographic changes and failed conservative measures, the patient   decided to proceed with definitive treatment, total knee replacement.  Risks of infection, DVT, component failure, need for revision surgery,  neurovascular injury were reviewed in the office setting.  The postop course was reviewed stressing the efforts to maximize post-operative satisfaction and function.  Consent was obtained for benefit of pain   relief.      PROCEDURE IN DETAIL:  The patient was brought to the operative theater.   Once adequate anesthesia, preoperative antibiotics, 2 gm of Ancef,1 gm of Tranexamic Acid, and 10 mg of Decadron administered, the patient was positioned supine with a left thigh tourniquet placed.  The  left lower extremity was prepped and draped in sterile fashion.  A time-   out was performed identifying the patient, planned procedure, and the appropriate extremity.      The left lower extremity was placed in the [facility] leg holder.  The leg was   exsanguinated, tourniquet elevated to 225 mmHg.  A midline incision was   made followed  by median parapatellar arthrotomy.  Following initial   exposure, attention was first directed to the patella.  Precut   measurement was noted to be 20 mm.  I resected down to 12-13 mm and used a   32 anatomic patellar button to restore patellar height as well as cover the cut surface.      The lug holes were drilled and a metal shim was placed to protect the   patella from retractors and saw blade during the procedure.      At this point, attention was now directed to the femur.  The femoral   canal was opened with a drill, irrigated to try to prevent fat emboli.  An   intramedullary rod was passed at 3 degrees valgus, 9 mm of bone was   resected off the distal femur.  Following this resection, the tibia was   subluxated anteriorly.  Using the extramedullary guide, 2 mm of bone was resected off   the proximal medial tibia.  We confirmed the gap would be   stable medially and laterally with a size 5 spacer block as well as confirmed that the tibial cut was perpendicular in the coronal plane, checking with an alignment rod.      Once this was done, I sized the femur to  be a size 4 in the anterior-   posterior dimension, chose a narrow component based on medial and   lateral dimension.  The size 4 rotation block was then pinned in   position anterior referenced using the C-clamp to set rotation.  The   anterior, posterior, and  chamfer cuts were made without difficulty nor   notching making certain that I was along the anterior cortex to help   with flexion gap stability.      The final box cut was made off the lateral aspect of distal femur.      At this point, the tibia was sized to be a size 3.  The size 3 tray was   then pinned in position through the medial third of the tubercle,   drilled, and keel punched.  Trial reduction was now carried with a 4 femur,  3 tibia, a size 6 mm CR MS insert, and the 32 anatomic patella botton.  The knee was brought to full extension with good flexion stability with the patella   tracking through the trochlea without application of pressure.  Given   all these findings the trial components removed.  Final components were   opened and cement was mixed.  The knee was irrigated with normal saline solution and pulse lavage.  The synovial lining was   then injected with 30 cc of 0.25% Marcaine with epinephrine, 1 cc of Toradol and 30 cc of NS for a total of 61 cc.     Final implants were then cemented onto cleaned and dried cut surfaces of bone with the knee brought to extension with a size 6 mm CR MS trial insert.      Once the cement had fully cured, excess cement was removed   throughout the knee.  I confirmed that I was satisfied with the range of   motion and stability, and the final size 6 mm CR MS AOX insert was chosen.  It was   placed into the knee.      The tourniquet had been let down at 27 minutes.  No significant   hemostasis was required.  The extensor mechanism was then reapproximated using #1  Vicryl and #1 Stratafix sutures with the knee   in flexion.  The   remaining wound was closed with 2-0 Vicryl and  running 4-0 Monocryl.   The knee was cleaned, dried, dressed sterilely using Dermabond and   Aquacel dressing.  The patient was then   brought to recovery room in stable condition, tolerating the procedure   well.   Please note that Physician Assistant, Rosalene Billings, PA-C was present for the entirety of the case, and was utilized for pre-operative positioning, peri-operative retractor management, general facilitation of the procedure and for primary wound closure at the end of the case.              Madlyn Frankel Charlann Boxer, M.D.    05/08/2023 8:35 AM

## 2023-05-08 NOTE — Anesthesia Procedure Notes (Signed)
Anesthesia Regional Block: Adductor canal block   Pre-Anesthetic Checklist: , timeout performed,  Correct Patient, Correct Site, Correct Laterality,  Correct Procedure, Correct Position, site marked,  Risks and benefits discussed,  Surgical consent,  Pre-op evaluation,  At surgeon's request and post-op pain management  Laterality: Left  Prep: chloraprep       Needles:  Injection technique: Single-shot  Needle Type: Echogenic Needle     Needle Length: 9cm      Additional Needles:   Procedures:,,,, ultrasound used (permanent image in chart),,    Narrative:  Start time: 05/08/2023 6:48 AM End time: 05/08/2023 6:54 AM Injection made incrementally with aspirations every 5 mL.  Performed by: Personally  Anesthesiologist: Eilene Ghazi, MD  Additional Notes: Patient tolerated the procedure well without complications

## 2023-05-08 NOTE — Interval H&P Note (Signed)
History and Physical Interval Note:  05/08/2023 6:44 AM  Erin Mccann  has presented today for surgery, with the diagnosis of Left knee osteoarthritis.  The various methods of treatment have been discussed with the patient and family. After consideration of risks, benefits and other options for treatment, the patient has consented to  Procedure(s): TOTAL KNEE ARTHROPLASTY (Left) as a surgical intervention.  The patient's history has been reviewed, patient examined, no change in status, stable for surgery.  I have reviewed the patient's chart and labs.  Questions were answered to the patient's satisfaction.     [name]

## 2023-05-08 NOTE — Anesthesia Postprocedure Evaluation (Signed)
Anesthesia Post Note  Patient: Erin Mccann  Procedure(s) Performed: TOTAL KNEE ARTHROPLASTY (Left: Knee)     Patient location during evaluation: PACU Anesthesia Type: Spinal Level of consciousness: oriented and awake and alert Pain management: pain level controlled Vital Signs Assessment: post-procedure vital signs reviewed and stable Respiratory status: spontaneous breathing, respiratory function stable and patient connected to nasal cannula oxygen Cardiovascular status: blood pressure returned to baseline and stable Postop Assessment: no headache, no backache and no apparent nausea or vomiting Anesthetic complications: no  No notable events documented.  Last Vitals:  Vitals:   05/08/23 1030 05/08/23 1045  BP: (!) 167/80 (!) 164/85  Pulse: 73 78  Resp: 11 19  Temp:    SpO2: 99% 95%    Last Pain:  Vitals:   05/08/23 1000  TempSrc:   PainSc: 2                  Henry Demeritt S

## 2023-05-08 NOTE — Anesthesia Procedure Notes (Signed)
Anesthesia Procedure Image    

## 2023-05-09 ENCOUNTER — Encounter (HOSPITAL_COMMUNITY): Payer: Self-pay | Admitting: Orthopedic Surgery

## 2023-05-09 ENCOUNTER — Other Ambulatory Visit: Payer: Self-pay | Admitting: Internal Medicine

## 2023-05-09 DIAGNOSIS — M1712 Unilateral primary osteoarthritis, left knee: Secondary | ICD-10-CM | POA: Diagnosis not present

## 2023-05-09 DIAGNOSIS — Z87891 Personal history of nicotine dependence: Secondary | ICD-10-CM | POA: Diagnosis not present

## 2023-05-09 DIAGNOSIS — Z7982 Long term (current) use of aspirin: Secondary | ICD-10-CM | POA: Diagnosis not present

## 2023-05-09 DIAGNOSIS — I10 Essential (primary) hypertension: Secondary | ICD-10-CM | POA: Diagnosis not present

## 2023-05-09 DIAGNOSIS — Z79899 Other long term (current) drug therapy: Secondary | ICD-10-CM | POA: Diagnosis not present

## 2023-05-09 DIAGNOSIS — E039 Hypothyroidism, unspecified: Secondary | ICD-10-CM | POA: Diagnosis not present

## 2023-05-09 DIAGNOSIS — I2510 Atherosclerotic heart disease of native coronary artery without angina pectoris: Secondary | ICD-10-CM | POA: Diagnosis not present

## 2023-05-09 NOTE — TOC Transition Note (Signed)
Transition of Care [facility]) - CM/SW Discharge Note  Patient Details  Name: Erin Mccann MRN: [id number] Date of Birth: 09-08-1956  [facility] CM/SW Contact:  Ewing Schlein, LCSW Phone Number: 05/09/2023, 9:52 AM  Clinical Narrative: Patient is expected to discharge home after working with PT. CSW met with patient and spouse to confirm discharge plan and needs. Patient will go home with OPPT [facility]. Patient will need a rolling walker, which [employer] delivered to patient's room. TOC signing off.    Final next level of care: OP Rehab Barriers to Discharge: No Barriers Identified  Patient Goals and CMS Choice CMS Medicare.gov Compare Post Acute Care list provided to:: Patient Choice offered to / list presented to : Patient  Discharge Plan and Services Additional resources added to the After Visit Summary for          DME Arranged: Walker rolling DME Agency: [employer] Representative spoke with at DME Agency: Prearranged in orthopedist's office  Social Determinants of Health (SDOH) Interventions SDOH Screenings   Food Insecurity: No Food Insecurity (05/08/2023)  Housing: Low Risk  (05/08/2023)  Transportation Needs: No Transportation Needs (05/08/2023)  Utilities: Not At Risk (05/08/2023)  Alcohol Screen: Low Risk  (01/18/2023)  Depression (PHQ2-9): Low Risk  (01/22/2023)  Financial Resource Strain: Low Risk  (01/18/2023)  Physical Activity: Insufficiently Active (01/18/2023)  Social Connections: Moderately Integrated (01/18/2023)  Stress: No Stress Concern Present (01/18/2023)  Tobacco Use: Medium Risk (05/08/2023)   Readmission Risk Interventions     No data to display

## 2023-05-09 NOTE — Progress Notes (Signed)
Physical Therapy Treatment Patient Details Name: Erin Mccann MRN: [id number] DOB: 06/24/1956 Today's Date: 05/09/2023   History of Present Illness 67 yo female presents to therapy s/p L TKA on 05/08/2023 due to failure of conservative measures. Pt PMH includes but is not limited to: hypothyroidism, anxiety, cardiomyopathy, CAD< HTN, HLD, CAD, hepatitis, and kidney stones.    PT Comments  Pt received up in chair, pre-medicated prior to session with increased tolerance for mobility and gait training. Continues to feel nauseous, no c/o dizziness. Pt and caregiver educated on TKA exercise and mobility packet with good understanding. Gait with RW and CG/Supervision 154ft progressing to step through gait pattern. Will train on stairs after lunch.    If plan is discharge home, recommend the following: A little help with walking and/or transfers;Assist for transportation;Help with stairs or ramp for entrance;A little help with bathing/dressing/bathroom   Can travel by private vehicle        Equipment Recommendations  Rolling walker (2 wheels)    Recommendations for Other Services       Precautions / Restrictions Precautions Precautions: Knee Precaution Booklet Issued: Yes (comment) Precaution Comments:  (Educated pt and caregiver on L LE ther ex, proper positioning, car transfers, and stair negotiation) Restrictions Weight Bearing Restrictions: Yes LLE Weight Bearing: Weight bearing as tolerated     Mobility  Bed Mobility Overal bed mobility: Needs Assistance Bed Mobility: Supine to Sit     Supine to sit: Min guard, HOB elevated     General bed mobility comments: increased time due to reports of nausea, cues    Transfers Overall transfer level: Needs assistance Equipment used: Rolling walker (2 wheels) Transfers: Sit to/from Stand Sit to Stand: Supervision           General transfer comment: cues for safety, UE and AD placement    Ambulation/Gait Ambulation/Gait  assistance: Min guard, Supervision Gait Distance (Feet):  (125) Assistive device: Rolling walker (2 wheels) Gait Pattern/deviations: Step-to pattern, Step-through pattern, Decreased stance time - left, Antalgic Gait velocity: decreased     General Gait Details: Initial step to pattern progressing to step through with more normalized gait, vc's for heel strike and sequencing   Stairs Stairs: Yes       General stair comments: Educated pt and caregiver on proper stair negotiation, will practice in pm   Wheelchair Mobility     Tilt Bed    Modified Rankin (Stroke Patients Only)       Balance Overall balance assessment: Needs assistance Sitting-balance support: Feet supported Sitting balance-Leahy Scale: Good     Standing balance support: Bilateral upper extremity supported, During functional activity, Reliant on assistive device for balance Standing balance-Leahy Scale: Fair Standing balance comment:  (Reliant on AD for dynamic activies, no LOB)                            Cognition Arousal/Alertness: Awake/alert Behavior During Therapy: WFL for tasks assessed/performed Overall Cognitive Status: Within Functional Limits for tasks assessed                                          Exercises Total Joint Exercises Ankle Circles/Pumps: AROM, Both, 10 reps, Seated Quad Sets: AROM, Left, 5 reps Long Arc Quad: AAROM, Left, 10 reps, Seated Knee Flexion: AROM, Left, 15 reps, Seated    General Comments General comments (skin  integrity, edema, etc.):  (Pt given TKA packet and educated with good understanding. All pt questions and concerns addressed)      Pertinent Vitals/Pain Pain Assessment Pain Assessment: 0-10 Pain Score: 6  Pain Location: L leg/knee Pain Descriptors / Indicators: Aching, Constant, Discomfort, Operative site guarding Pain Intervention(s): Premedicated before session, Ice applied    Home Living                           Prior Function            PT Goals (current goals can now be found in the care plan section) Acute Rehab PT Goals Patient Stated Goal: to be able to ambulate on uneven surfaces on Progress towards PT goals: Progressing toward goals    Frequency    7X/week      PT Plan      Co-evaluation              AM-PAC PT "6 Clicks" Mobility   Outcome Measure  Help needed turning from your back to your side while in a flat bed without using bedrails?: A Little Help needed moving from lying on your back to sitting on the side of a flat bed without using bedrails?: A Little Help needed moving to and from a bed to a chair (including a wheelchair)?: A Little Help needed standing up from a chair using your arms (e.g., wheelchair or bedside chair)?: A Little Help needed to walk in hospital room?: A Little Help needed climbing 3-5 steps with a railing? : Total 6 Click Score: 16    End of Session Equipment Utilized During Treatment: Gait belt Activity Tolerance: Patient tolerated treatment well Patient left: in chair;with call bell/phone within reach;with family/visitor present Nurse Communication: Mobility status;Other (comment);Patient requests pain meds PT Visit Diagnosis: Unsteadiness on feet (R26.81);Other abnormalities of gait and mobility (R26.89);Muscle weakness (generalized) (M62.81);Difficulty in walking, not elsewhere classified (R26.2);Pain Pain - Right/Left: Left Pain - part of body: Knee;Leg     Time: 1027-2536 PT Time Calculation (min) (ACUTE ONLY): 42 min  Charges:    $Gait Training: 8-22 mins $Therapeutic Exercise: 8-22 mins $Therapeutic Activity: 8-22 mins PT General Charges $$ ACUTE PT VISIT: 1 Visit                    Zadie Cleverly, PTA  [name] 05/09/2023, 1:22 PM

## 2023-05-09 NOTE — Care Management Obs Status (Signed)
MEDICARE OBSERVATION STATUS NOTIFICATION   Patient Details  Name: Erin Mccann MRN: [id number] Date of Birth: November 04, 1955   Medicare Observation Status Notification Given:  Yes    Ewing Schlein, LCSW 05/09/2023, 12:31 PM

## 2023-05-09 NOTE — Progress Notes (Signed)
Physical Therapy Treatment Patient Details Name: Erin Mccann MRN: [id number] DOB: 09-Apr-1956 Today's Date: 05/09/2023   History of Present Illness 67 yo female presents to therapy s/p L TKA on 05/08/2023 due to failure of conservative measures. Pt PMH includes but is not limited to: hypothyroidism, anxiety, cardiomyopathy, CAD< HTN, HLD, CAD, hepatitis, and kidney stones.    PT Comments  Pt received up in chair, premedicated for stair training. Pt demonstrated transfers and gait with RW and Supervision for safety. Good demonstration of safe stair negotiation with caregiver present x3 steps with single rail. Pt is anxious to return home this pm.    If plan is discharge home, recommend the following: A little help with walking and/or transfers;Assist for transportation;Help with stairs or ramp for entrance;A little help with bathing/dressing/bathroom   Can travel by private vehicle        Equipment Recommendations  Rolling walker (2 wheels)    Recommendations for Other Services       Precautions / Restrictions Precautions Precautions: Knee Precaution Booklet Issued: Yes (comment) Restrictions Weight Bearing Restrictions: Yes LLE Weight Bearing: Weight bearing as tolerated     Mobility  Bed Mobility Overal bed mobility: Needs Assistance Bed Mobility: Supine to Sit     Supine to sit: Min guard, HOB elevated     General bed mobility comments: NT    Transfers Overall transfer level: Needs assistance Equipment used: Rolling walker (2 wheels) Transfers: Sit to/from Stand Sit to Stand: Supervision           General transfer comment: cues for safety, UE and AD placement    Ambulation/Gait Ambulation/Gait assistance: Supervision Gait Distance (Feet):  (75) Assistive device: Rolling walker (2 wheels) Gait Pattern/deviations: Step-through pattern, Antalgic Gait velocity: decreased     General Gait Details: Initial step to pattern progressing to step through with  more normalized gait, vc's for heel strike and sequencing   Stairs Stairs: Yes Stairs assistance: Min guard Stair Management: One rail Left, Step to pattern Number of Stairs: 3 General stair comments: Care giver present for training. Good understanding and demonstration of proper technique   Wheelchair Mobility     Tilt Bed    Modified Rankin (Stroke Patients Only)       Balance Overall balance assessment: Needs assistance Sitting-balance support: Feet supported Sitting balance-Leahy Scale: Good     Standing balance support: Bilateral upper extremity supported, During functional activity, Reliant on assistive device for balance Standing balance-Leahy Scale: Fair Standing balance comment:  (Reliant on AD for dynamic activies, no LOB)                            Cognition Arousal/Alertness: Awake/alert Behavior During Therapy: WFL for tasks assessed/performed Overall Cognitive Status: Within Functional Limits for tasks assessed                                          Exercises Total Joint Exercises Ankle Circles/Pumps: AROM, Both, 10 reps, Seated Quad Sets: AROM, Left, 5 reps Long Arc Quad: AAROM, Left, 10 reps, Seated Knee Flexion: AROM, Left, 15 reps, Seated    General Comments        Pertinent Vitals/Pain Pain Assessment Pain Assessment: 0-10 Pain Score: 3  Pain Location: L leg/knee Pain Descriptors / Indicators: Aching, Discomfort, Operative site guarding Pain Intervention(s): Premedicated before session, Ice applied  Home Living                          Prior Function            PT Goals (current goals can now be found in the care plan section) Acute Rehab PT Goals Patient Stated Goal: to be able to ambulate on uneven surfaces on Progress towards PT goals: Progressing toward goals    Frequency    7X/week      PT Plan      Co-evaluation              AM-PAC PT "6 Clicks" Mobility    Outcome Measure  Help needed turning from your back to your side while in a flat bed without using bedrails?: A Little Help needed moving from lying on your back to sitting on the side of a flat bed without using bedrails?: A Little Help needed moving to and from a bed to a chair (including a wheelchair)?: A Little Help needed standing up from a chair using your arms (e.g., wheelchair or bedside chair)?: A Little Help needed to walk in hospital room?: A Little Help needed climbing 3-5 steps with a railing? : Total 6 Click Score: 16    End of Session         PT Visit Diagnosis: Unsteadiness on feet (R26.81);Other abnormalities of gait and mobility (R26.89);Muscle weakness (generalized) (M62.81);Difficulty in walking, not elsewhere classified (R26.2);Pain Pain - Right/Left: Left Pain - part of body: Knee;Leg     Time: 1530-1540 PT Time Calculation (min) (ACUTE ONLY): 10 min  Charges:    $Gait Training: 8-22 mins PT General Charges $$ ACUTE PT VISIT: 1 Visit                    Zadie Cleverly, PTA  [name] 05/09/2023, 4:39 PM

## 2023-05-09 NOTE — Plan of Care (Signed)
  Problem: Pain Management: Goal: Pain level will decrease with appropriate interventions Outcome: Progressing   Problem: Coping: Goal: Level of anxiety will decrease Outcome: Progressing   

## 2023-05-09 NOTE — Progress Notes (Signed)
   Subjective: 1 Day Post-Op Procedure(s) (LRB): TOTAL KNEE ARTHROPLASTY (Left) Patient reports pain as mild.   Patient seen in rounds by Dr. Charlann Boxer. Patient is well, and has had no acute complaints or problems. No acute events overnight. Foley catheter removed. Patient ambulated a few feet with PT but was limited by nausea. We will start therapy today.   Objective: Vital signs in last 24 hours: Temp:  [97.5 F (36.4 C)-98.1 F (36.7 C)] 98.1 F (36.7 C) (07/31 0539) Pulse Rate:  [65-96] 86 (07/31 0539) Resp:  [11-21] 16 (07/31 0539) BP: (115-167)/(50-96) 154/73 (07/31 0539) SpO2:  [95 %-100 %] 97 % (07/31 0539)  Intake/Output from previous day:  Intake/Output Summary (Last 24 hours) at 05/09/2023 0732 Last data filed at 05/09/2023 0600 Gross per 24 hour  Intake 3772.11 ml  Output 2480 ml  Net 1292.11 ml     Intake/Output this shift: No intake/output data recorded.  Labs: Recent Labs    05/09/23 0351  HGB 10.6*   Recent Labs    05/09/23 0351  WBC 10.2  RBC 3.55*  HCT 33.3*  PLT 203   Recent Labs    05/09/23 0351  NA 133*  K 4.8  CL 102  CO2 24  BUN 18  CREATININE 0.82  GLUCOSE 121*  CALCIUM 8.6*   No results for input(s): "LABPT", "INR" in the last 72 hours.  Exam: General - Patient is Alert and Oriented Extremity - Neurologically intact Sensation intact distally Intact pulses distally Dorsiflexion/Plantar flexion intact Dressing - dressing C/D/I Motor Function - intact, moving foot and toes well on exam.   Past Medical History:  Diagnosis Date   Arthritis    CAD (coronary artery disease)    Cardiomyopathy    Cardiomyopathy    Hepatitis    in 2nd grade   History of kidney stones    Hyperlipidemia    Hypertension    Hypothyroid     Assessment/Plan: 1 Day Post-Op Procedure(s) (LRB): TOTAL KNEE ARTHROPLASTY (Left) Principal Problem:   S/P total knee arthroplasty, left  Estimated body mass index is 32.76 kg/m as calculated from the  following:   Height as of this encounter: 5\' 6"  (1.676 m).   Weight as of this encounter: 92.1 kg. Advance diet Up with therapy D/C IV fluids   Patient's anticipated LOS is less than 2 midnights, meeting these requirements: - Younger than 75 - Lives within 1 hour of care - Has a competent adult at home to recover with post-op recover - NO history of  - Chronic pain requiring opiods  - Diabetes  - Coronary Artery Disease  - Heart failure  - Heart attack  - Stroke  - DVT/VTE  - Cardiac arrhythmia  - Respiratory Failure/COPD  - Renal failure  - Anemia  - Advanced Liver disease     DVT Prophylaxis - Aspirin Weight bearing as tolerated.  Hgb stable at 10.6 this AM.  Will see how she progresses today in terms of nausea/ orthostasis.   Plan is to go Home after hospital stay. Plan for discharge today following 1-2 sessions of PT as long as they are meeting their goals.   Patient is scheduled for OPPT. Follow up in the office in 2 weeks.   Rosalene Billings, PA-C Orthopedic Surgery [phone] 05/09/2023, 7:32 AM

## 2023-05-11 DIAGNOSIS — M25562 Pain in left knee: Secondary | ICD-10-CM | POA: Diagnosis not present

## 2023-05-14 DIAGNOSIS — M25562 Pain in left knee: Secondary | ICD-10-CM | POA: Diagnosis not present

## 2023-05-15 NOTE — Discharge Summary (Signed)
Patient ID: Erin Mccann MRN: [id number] DOB/AGE: 1956/03/07 67 y.o.  Admit date: 05/08/2023 Discharge date: 05/09/2023  Admission Diagnoses:  Left knee osteoarthritis  Discharge Diagnoses:  Principal Problem:   S/P total knee arthroplasty, left   Past Medical History:  Diagnosis Date   Arthritis    CAD (coronary artery disease)    Cardiomyopathy    Cardiomyopathy    Hepatitis    in 2nd grade   History of kidney stones    Hyperlipidemia    Hypertension    Hypothyroid     Surgeries: Procedure(s): TOTAL KNEE ARTHROPLASTY on 05/08/2023   Consultants:   Discharged Condition: Improved  Hospital Course: Javeria Lenis is an 67 y.o. female who was admitted 05/08/2023 for operative treatment ofS/P total knee arthroplasty, left. Patient has severe unremitting pain that affects sleep, daily activities, and work/hobbies. After pre-op clearance the patient was taken to the operating room on 05/08/2023 and underwent  Procedure(s): TOTAL KNEE ARTHROPLASTY.    Patient was given perioperative antibiotics:  Anti-infectives (From admission, onward)    Start     Dose/Rate Route Frequency Ordered Stop   05/08/23 2200  dapsone tablet 25 mg  Status:  Discontinued        25 mg Oral 2 times daily 05/08/23 1224 05/09/23 2144   05/08/23 1400  ceFAZolin (ANCEF) IVPB 2g/100 mL premix        2 g 200 mL/hr over 30 Minutes Intravenous Every 6 hours 05/08/23 1224 05/08/23 2142   05/08/23 0600  ceFAZolin (ANCEF) IVPB 2g/100 mL premix        2 g 200 mL/hr over 30 Minutes Intravenous On call to O.R. 05/08/23 0534 05/08/23 0726        Patient was given sequential compression devices, early ambulation, and chemoprophylaxis to prevent DVT. Patient worked with PT and was meeting their goals regarding safe ambulation and transfers.  Patient benefited maximally from hospital stay and there were no complications.    Recent vital signs: No data found.   Recent laboratory studies: No results for  input(s): "WBC", "HGB", "HCT", "PLT", "NA", "K", "CL", "CO2", "BUN", "CREATININE", "GLUCOSE", "INR", "CALCIUM" in the last 72 hours.  Invalid input(s): "PT", "2"   Discharge Medications:   Allergies as of 05/09/2023       Reactions   Codeine Nausea Only        Medication List     TAKE these medications    acetaminophen 500 MG tablet Commonly known as: TYLENOL Take 1,500 mg by mouth daily as needed for moderate pain.   ALPRAZolam 0.25 MG tablet Commonly known as: XANAX TAKE 1 TABLET BY MOUTH DAILY AS NEEDED FOR ANXIETY   aspirin EC 81 MG tablet Take 81 mg by mouth daily with lunch.   atorvastatin 20 MG tablet Commonly known as: LIPITOR TAKE 1 TABLET BY MOUTH EVERY DAY   carvedilol 3.125 MG tablet Commonly known as: COREG Take 1 tablet (3.125 mg total) by mouth 2 (two) times daily.   celecoxib 200 MG capsule Commonly known as: CELEBREX Take 200 mg by mouth 2 (two) times daily.   dapsone 25 MG tablet Take 25 mg by mouth 2 (two) times daily.   Ferrex 150 Forte 150-0.025-1 MG Caps Generic drug: Iron Polysacch Cmplx-B12-FA TAKE 1 CAPSULE BY MOUTH EVERY DAY What changed: when to take this   Fish Oil 1200 MG Caps Take 1,200 mg by mouth 2 (two) times daily.   levothyroxine 50 MCG tablet Commonly known as: SYNTHROID Take 1 tablet (50 mcg  total) by mouth daily.   lisinopril 5 MG tablet Commonly known as: ZESTRIL TAKE 1/2 TABLET BY MOUTH DAILY What changed: when to take this   loperamide 1 MG/5ML solution Commonly known as: IMODIUM Take 2-4 mg by mouth as needed for diarrhea or loose stools.   metroNIDAZOLE 1 % gel Commonly known as: METROGEL Apply 1 application topically at bedtime. Apply to face for rosacea   MULTI-VITAMIN DAILY PO Take 1 tablet by mouth daily with lunch.   PreserVision AREDS 2 Caps Take 1 capsule by mouth in the morning and at bedtime.   PROBIOTIC PO Take 1 tablet by mouth daily.   rosuvastatin 20 MG tablet Commonly known as:  Crestor Take 1 tablet (20 mg total) by mouth daily.   Sulfacetamide Sodium-Sulfur 10-2 % Liqd Apply 1 application  topically 2 (two) times daily. Apply to face for rosacea   Vitamin D3 50 MCG (2000 UT) Tabs Take 2,000 Units by mouth daily with lunch.               Discharge Care Instructions  (From admission, onward)           Start     Ordered   05/09/23 0000  Change dressing       Comments: Maintain surgical dressing until follow up in the clinic. If the edges start to pull up, may reinforce with tape. If the dressing is no longer working, may remove and cover with gauze and tape, but must keep the area dry and clean.  Call with any questions or concerns.   05/09/23 0734            Diagnostic Studies: ECHOCARDIOGRAM COMPLETE  Result Date: 04/20/2023    ECHOCARDIOGRAM REPORT   Patient Name:   Erin Mccann Date of Exam: 04/19/2023 Medical Rec #:  [id number]       Height:       66.0 in Accession #:    [id number]      Weight:       205.8 lb Date of Birth:  October 09, 1956        BSA:          2.024 m Patient Age:    67 years        BP:           158/82 mmHg Patient Gender: F               HR:           77 bpm. Exam Location:  [facility] Procedure: 2D Echo, 3D Echo, Cardiac Doppler, Color Doppler and Strain Analysis Indications:    I42.9 Cardiomyopathy (unspecified)  History:        Patient has no prior history of Echocardiogram examinations.                 Cardiomyopathy, CAD; Risk Factors:Hypertension and Dyslipidemia.  Sonographer:    Ilda Mori RDCS, MHA Referring Phys: [phone] Delton See BERRY  Sonographer Comments: Technically difficult study due to poor echo windows and patient is obese. Image acquisition challenging due to patient body habitus and Large and dense breast tissue. IMPRESSIONS  1. Left ventricular ejection fraction, by estimation, is 50 to 55%. Left ventricular ejection fraction by 3D volume is 54 %. The left ventricle has low normal function. The left ventricle  has no regional wall motion abnormalities. There is mild left ventricular hypertrophy. Left ventricular diastolic parameters are consistent with Grade I diastolic dysfunction (impaired relaxation). The average left ventricular global longitudinal  strain is -19.8 %.  2. Right ventricular systolic function is normal. The right ventricular size is normal. Tricuspid regurgitation signal is inadequate for assessing PA pressure.  3. Left atrial size was mildly dilated.  4. A small pericardial effusion is present. There is no evidence of cardiac tamponade.  5. The mitral valve is normal in structure. Mild to moderate mitral valve regurgitation. No evidence of mitral stenosis.  6. The aortic valve is tricuspid. Aortic valve regurgitation is not visualized. No aortic stenosis is present.  7. The inferior vena cava is normal in size with greater than 50% respiratory variability, suggesting right atrial pressure of 3 mmHg. FINDINGS  Left Ventricle: Left ventricular ejection fraction, by estimation, is 50 to 55%. Left ventricular ejection fraction by 3D volume is 54 %. The left ventricle has low normal function. The left ventricle has no regional wall motion abnormalities. The average left ventricular global longitudinal strain is -19.8 %. The left ventricular internal cavity size was normal in size. There is mild left ventricular hypertrophy. Left ventricular diastolic parameters are consistent with Grade I diastolic dysfunction (impaired relaxation). Right Ventricle: The right ventricular size is normal. No increase in right ventricular wall thickness. Right ventricular systolic function is normal. Tricuspid regurgitation signal is inadequate for assessing PA pressure. Left Atrium: Left atrial size was mildly dilated. Right Atrium: Right atrial size was normal in size. Pericardium: A small pericardial effusion is present. There is no evidence of cardiac tamponade. Mitral Valve: The mitral valve is normal in structure. Mild to  moderate mitral valve regurgitation. No evidence of mitral valve stenosis. Tricuspid Valve: The tricuspid valve is normal in structure. Tricuspid valve regurgitation is mild . No evidence of tricuspid stenosis. Aortic Valve: The aortic valve is tricuspid. Aortic valve regurgitation is not visualized. No aortic stenosis is present. Pulmonic Valve: The pulmonic valve was normal in structure. Pulmonic valve regurgitation is not visualized. No evidence of pulmonic stenosis. Aorta: The aortic root is normal in size and structure. Venous: The inferior vena cava is normal in size with greater than 50% respiratory variability, suggesting right atrial pressure of 3 mmHg. IAS/Shunts: No atrial level shunt detected by color flow Doppler.  LEFT VENTRICLE PLAX 2D LVIDd:         4.20 cm         Diastology LVIDs:         3.90 cm         LV e' medial:    4.90 cm/s LV PW:         1.20 cm         LV E/e' medial:  17.7 LV IVS:        1.20 cm         LV e' lateral:   12.00 cm/s                                LV E/e' lateral: 7.2                                 2D                                Longitudinal  Strain                                2D Strain GLS  -19.8 %                                Avg:                                 3D Volume EF                                LV 3D EF:    Left                                             ventricul                                             ar                                             ejection                                             fraction                                             by 3D                                             volume is                                             54 %.                                 3D Volume EF:                                3D EF:        54 %                                LV EDV:       173 ml  LV ESV:       80 ml                                LV SV:        93 ml RIGHT  VENTRICLE RV Basal diam:  2.50 cm RV Mid diam:    2.30 cm RV S prime:     14.60 cm/s LEFT ATRIUM             Index        RIGHT ATRIUM          Index LA diam:        4.40 cm 2.17 cm/m   RA Area:     9.66 cm LA Vol (A2C):   69.8 ml 34.48 ml/m  RA Volume:   19.00 ml 9.39 ml/m LA Vol (A4C):   42.6 ml 21.04 ml/m LA Biplane Vol: 54.0 ml 26.68 ml/m   AORTA Ao Root diam: 2.60 cm Ao Asc diam:  3.30 cm MITRAL VALVE MV Area (PHT): 3.65 cm MV Decel Time: 208 msec MV E velocity: 86.50 cm/s MV A velocity: 85.90 cm/s MV E/A ratio:  1.01 Julien Nordmann MD Electronically signed by Julien Nordmann MD Signature Date/Time: 04/20/2023/8:21:58 AM    Final     Disposition: Discharge disposition: 01-Home or Self Care       Discharge Instructions     Call MD / Call 911   Complete by: As directed    If you experience chest pain or shortness of breath, CALL 911 and be transported to the hospital emergency room.  If you develope a fever above 101 F, pus (white drainage) or increased drainage or redness at the wound, or calf pain, call your surgeon's office.   Change dressing   Complete by: As directed    Maintain surgical dressing until follow up in the clinic. If the edges start to pull up, may reinforce with tape. If the dressing is no longer working, may remove and cover with gauze and tape, but must keep the area dry and clean.  Call with any questions or concerns.   Constipation Prevention   Complete by: As directed    Drink plenty of fluids.  Prune juice may be helpful.  You may use a stool softener, such as Colace (over the counter) 100 mg twice a day.  Use MiraLax (over the counter) for constipation as needed.   Diet - low sodium heart healthy   Complete by: As directed    Increase activity slowly as tolerated   Complete by: As directed    Weight bearing as tolerated with assist device (walker, cane, etc) as directed, use it as long as suggested by your surgeon or therapist, typically at least 4-6 weeks.    Post-operative opioid taper instructions:   Complete by: As directed    POST-OPERATIVE OPIOID TAPER INSTRUCTIONS: It is important to wean off of your opioid medication as soon as possible. If you do not need pain medication after your surgery it is ok to stop day one. Opioids include: Codeine, Hydrocodone(Norco, Vicodin), Oxycodone(Percocet, oxycontin) and hydromorphone amongst others.  Long term and even short term use of opiods can cause: Increased pain response Dependence Constipation Depression Respiratory depression And more.  Withdrawal symptoms can include Flu like symptoms Nausea, vomiting And more Techniques to manage these symptoms Hydrate well Eat regular healthy meals Stay active Use relaxation techniques(deep breathing, meditating, yoga) Do Not  substitute Alcohol to help with tapering If you have been on opioids for less than two weeks and do not have pain than it is ok to stop all together.  Plan to wean off of opioids This plan should start within one week post op of your joint replacement. Maintain the same interval or time between taking each dose and first decrease the dose.  Cut the total daily intake of opioids by one tablet each day Next start to increase the time between doses. The last dose that should be eliminated is the evening dose.      TED hose   Complete by: As directed    Use stockings (TED hose) for 2 weeks on both leg(s).  You may remove them at night for sleeping.        Follow-up Information     Durene Romans, MD. Schedule an appointment as soon as possible for a visit in 2 week(s).   Specialty: Orthopedic Surgery Contact information: [address] [place] [address] [phone]                  Signed: Cassandria Anger 05/15/2023, 7:20 AM

## 2023-05-16 DIAGNOSIS — M25562 Pain in left knee: Secondary | ICD-10-CM | POA: Diagnosis not present

## 2023-05-22 DIAGNOSIS — M25562 Pain in left knee: Secondary | ICD-10-CM | POA: Diagnosis not present

## 2023-05-25 DIAGNOSIS — M25562 Pain in left knee: Secondary | ICD-10-CM | POA: Diagnosis not present

## 2023-05-28 DIAGNOSIS — M25562 Pain in left knee: Secondary | ICD-10-CM | POA: Diagnosis not present

## 2023-05-28 DIAGNOSIS — Z96652 Presence of left artificial knee joint: Secondary | ICD-10-CM | POA: Diagnosis not present

## 2023-05-28 DIAGNOSIS — M6281 Muscle weakness (generalized): Secondary | ICD-10-CM | POA: Diagnosis not present

## 2023-05-28 DIAGNOSIS — R262 Difficulty in walking, not elsewhere classified: Secondary | ICD-10-CM | POA: Diagnosis not present

## 2023-05-30 DIAGNOSIS — M25562 Pain in left knee: Secondary | ICD-10-CM | POA: Diagnosis not present

## 2023-05-30 DIAGNOSIS — R262 Difficulty in walking, not elsewhere classified: Secondary | ICD-10-CM | POA: Diagnosis not present

## 2023-05-30 DIAGNOSIS — Z96652 Presence of left artificial knee joint: Secondary | ICD-10-CM | POA: Diagnosis not present

## 2023-05-30 DIAGNOSIS — M6281 Muscle weakness (generalized): Secondary | ICD-10-CM | POA: Diagnosis not present

## 2023-06-04 DIAGNOSIS — M6281 Muscle weakness (generalized): Secondary | ICD-10-CM | POA: Diagnosis not present

## 2023-06-04 DIAGNOSIS — R262 Difficulty in walking, not elsewhere classified: Secondary | ICD-10-CM | POA: Diagnosis not present

## 2023-06-04 DIAGNOSIS — M25562 Pain in left knee: Secondary | ICD-10-CM | POA: Diagnosis not present

## 2023-06-04 DIAGNOSIS — Z96652 Presence of left artificial knee joint: Secondary | ICD-10-CM | POA: Diagnosis not present

## 2023-06-08 DIAGNOSIS — M25562 Pain in left knee: Secondary | ICD-10-CM | POA: Diagnosis not present

## 2023-06-08 DIAGNOSIS — M6281 Muscle weakness (generalized): Secondary | ICD-10-CM | POA: Diagnosis not present

## 2023-06-08 DIAGNOSIS — Z96652 Presence of left artificial knee joint: Secondary | ICD-10-CM | POA: Diagnosis not present

## 2023-06-08 DIAGNOSIS — R262 Difficulty in walking, not elsewhere classified: Secondary | ICD-10-CM | POA: Diagnosis not present

## 2023-06-12 DIAGNOSIS — Z96652 Presence of left artificial knee joint: Secondary | ICD-10-CM | POA: Diagnosis not present

## 2023-06-12 DIAGNOSIS — M25562 Pain in left knee: Secondary | ICD-10-CM | POA: Diagnosis not present

## 2023-06-12 DIAGNOSIS — M6281 Muscle weakness (generalized): Secondary | ICD-10-CM | POA: Diagnosis not present

## 2023-06-12 DIAGNOSIS — R262 Difficulty in walking, not elsewhere classified: Secondary | ICD-10-CM | POA: Diagnosis not present

## 2023-06-14 DIAGNOSIS — M6281 Muscle weakness (generalized): Secondary | ICD-10-CM | POA: Diagnosis not present

## 2023-06-14 DIAGNOSIS — M25562 Pain in left knee: Secondary | ICD-10-CM | POA: Diagnosis not present

## 2023-06-14 DIAGNOSIS — Z96652 Presence of left artificial knee joint: Secondary | ICD-10-CM | POA: Diagnosis not present

## 2023-06-14 DIAGNOSIS — R262 Difficulty in walking, not elsewhere classified: Secondary | ICD-10-CM | POA: Diagnosis not present

## 2023-06-18 DIAGNOSIS — M6281 Muscle weakness (generalized): Secondary | ICD-10-CM | POA: Diagnosis not present

## 2023-06-18 DIAGNOSIS — M25562 Pain in left knee: Secondary | ICD-10-CM | POA: Diagnosis not present

## 2023-06-18 DIAGNOSIS — Z96652 Presence of left artificial knee joint: Secondary | ICD-10-CM | POA: Diagnosis not present

## 2023-06-18 DIAGNOSIS — R262 Difficulty in walking, not elsewhere classified: Secondary | ICD-10-CM | POA: Diagnosis not present

## 2023-06-20 DIAGNOSIS — Z96652 Presence of left artificial knee joint: Secondary | ICD-10-CM | POA: Diagnosis not present

## 2023-06-20 DIAGNOSIS — R262 Difficulty in walking, not elsewhere classified: Secondary | ICD-10-CM | POA: Diagnosis not present

## 2023-06-20 DIAGNOSIS — M25562 Pain in left knee: Secondary | ICD-10-CM | POA: Diagnosis not present

## 2023-06-20 DIAGNOSIS — M6281 Muscle weakness (generalized): Secondary | ICD-10-CM | POA: Diagnosis not present

## 2023-06-21 DIAGNOSIS — Z96652 Presence of left artificial knee joint: Secondary | ICD-10-CM | POA: Diagnosis not present

## 2023-06-21 DIAGNOSIS — Z471 Aftercare following joint replacement surgery: Secondary | ICD-10-CM | POA: Diagnosis not present

## 2023-07-05 DIAGNOSIS — Z01419 Encounter for gynecological examination (general) (routine) without abnormal findings: Secondary | ICD-10-CM | POA: Diagnosis not present

## 2023-07-05 DIAGNOSIS — Z6834 Body mass index (BMI) 34.0-34.9, adult: Secondary | ICD-10-CM | POA: Diagnosis not present

## 2023-07-05 DIAGNOSIS — N958 Other specified menopausal and perimenopausal disorders: Secondary | ICD-10-CM | POA: Diagnosis not present

## 2023-07-05 DIAGNOSIS — Z1231 Encounter for screening mammogram for malignant neoplasm of breast: Secondary | ICD-10-CM | POA: Diagnosis not present

## 2023-07-05 LAB — HM DEXA SCAN: HM Dexa Scan: NORMAL

## 2023-07-05 LAB — HM MAMMOGRAPHY

## 2023-08-27 DIAGNOSIS — K080 Exfoliation of teeth due to systemic causes: Secondary | ICD-10-CM | POA: Diagnosis not present

## 2023-09-05 DIAGNOSIS — K529 Noninfective gastroenteritis and colitis, unspecified: Secondary | ICD-10-CM | POA: Diagnosis not present

## 2023-09-05 DIAGNOSIS — R197 Diarrhea, unspecified: Secondary | ICD-10-CM | POA: Diagnosis not present

## 2023-09-11 DIAGNOSIS — L719 Rosacea, unspecified: Secondary | ICD-10-CM | POA: Diagnosis not present

## 2023-09-11 DIAGNOSIS — D225 Melanocytic nevi of trunk: Secondary | ICD-10-CM | POA: Diagnosis not present

## 2023-09-11 DIAGNOSIS — L570 Actinic keratosis: Secondary | ICD-10-CM | POA: Diagnosis not present

## 2023-09-11 DIAGNOSIS — L920 Granuloma annulare: Secondary | ICD-10-CM | POA: Diagnosis not present

## 2023-09-11 DIAGNOSIS — L578 Other skin changes due to chronic exposure to nonionizing radiation: Secondary | ICD-10-CM | POA: Diagnosis not present

## 2023-09-26 DIAGNOSIS — K080 Exfoliation of teeth due to systemic causes: Secondary | ICD-10-CM | POA: Diagnosis not present

## 2023-09-30 ENCOUNTER — Other Ambulatory Visit: Payer: Self-pay | Admitting: Internal Medicine

## 2023-09-30 DIAGNOSIS — I10 Essential (primary) hypertension: Secondary | ICD-10-CM

## 2023-09-30 DIAGNOSIS — E039 Hypothyroidism, unspecified: Secondary | ICD-10-CM

## 2023-12-25 ENCOUNTER — Other Ambulatory Visit: Payer: Self-pay | Admitting: Internal Medicine

## 2024-01-14 ENCOUNTER — Ambulatory Visit (INDEPENDENT_AMBULATORY_CARE_PROVIDER_SITE_OTHER)

## 2024-01-14 VITALS — Ht 66.0 in | Wt 192.0 lb

## 2024-01-14 DIAGNOSIS — Z0000 Encounter for general adult medical examination without abnormal findings: Secondary | ICD-10-CM | POA: Diagnosis not present

## 2024-01-14 NOTE — Progress Notes (Signed)
 Subjective:   Erin Mccann is a 68 y.o. who presents for a Medicare Wellness preventive visit.  Visit Complete: Virtual I connected with  Erin Mccann on 01/14/24 by a video and audio enabled telemedicine application and verified that I am speaking with the correct person using two identifiers.  Patient Location: Home  Provider Location: Home Office  I discussed the limitations of evaluation and management by telemedicine. The patient expressed understanding and agreed to proceed.  Vital Signs: Because this visit was a virtual/telehealth visit, some criteria may be missing or patient reported. Any vitals not documented were not able to be obtained and vitals that have been documented are patient reported.  Persons Participating in Visit: Patient.  AWV Questionnaire: No: Patient Medicare AWV questionnaire was not completed prior to this visit.  Cardiac Risk Factors include: advanced age (>81men, >74 women);hypertension;Other (see comment);dyslipidemia, Risk factor comments: CAD, Cardiomyopathy     Objective:    Today's Vitals   01/14/24 0953  Weight: 192 lb (87.1 kg)  Height: 5\' 6"  (1.676 m)   Body mass index is 30.99 kg/m.     01/14/2024   10:29 AM 05/08/2023    5:47 AM 04/25/2023    8:03 AM  Advanced Directives  Does Patient Have a Medical Advance Directive? Yes Yes Yes  Type of Estate agent of [place];Living will Healthcare Power of [place];Living will Healthcare Power of [place];Living will  Does patient want to make changes to medical advance directive?  No - Patient declined   Copy of Healthcare Power of Attorney in Chart? No - copy requested No - copy requested No - copy requested    Current Medications (verified) Outpatient Encounter Medications as of 01/14/2024  Medication Sig   ALPRAZolam (XANAX) 0.25 MG tablet TAKE 1 TABLET BY MOUTH DAILY AS NEEDED FOR ANXIETY   aspirin EC 81 MG tablet Take 81 mg by mouth daily with lunch.    carvedilol (COREG) 3.125 MG tablet TAKE 1 TABLET BY MOUTH 2 TIMES DAILY.   celecoxib (CELEBREX) 200 MG capsule Take 200 mg by mouth daily.   Cholecalciferol (VITAMIN D3) 50 MCG (2000 UT) TABS Take 2,000 Units by mouth daily with lunch.   dapsone 25 MG tablet Take 25 mg by mouth 2 (two) times daily.   FERREX 150 FORTE 150-0.025-1 MG CAPS TAKE 1 CAPSULE BY MOUTH EVERY DAY   levothyroxine (SYNTHROID) 50 MCG tablet TAKE 1 TABLET BY MOUTH EVERY DAY   lisinopril (ZESTRIL) 5 MG tablet TAKE 1/2 TABLET BY MOUTH DAILY   metroNIDAZOLE (METROGEL) 1 % gel Apply 1 application topically at bedtime. Apply to face for rosacea   Multiple Vitamin (MULTI-VITAMIN DAILY PO) Take 1 tablet by mouth daily with lunch.   Multiple Vitamins-Minerals (PRESERVISION AREDS 2) CAPS Take 1 capsule by mouth in the morning and at bedtime.   Omega-3 Fatty Acids (FISH OIL) 1200 MG CAPS Take 1,200 mg by mouth 2 (two) times daily.   Probiotic Product (PROBIOTIC PO) Take 1 tablet by mouth daily.   rosuvastatin (CRESTOR) 20 MG tablet TAKE 1 TABLET BY MOUTH EVERY DAY   Sulfacetamide Sodium-Sulfur 10-2 % LIQD Apply 1 application  topically 2 (two) times daily. Apply to face for rosacea   acetaminophen (TYLENOL) 500 MG tablet Take 1,500 mg by mouth daily as needed for moderate pain.   atorvastatin (LIPITOR) 20 MG tablet TAKE 1 TABLET BY MOUTH EVERY DAY   loperamide (IMODIUM) 1 MG/5ML solution Take 2-4 mg by mouth as needed for diarrhea or loose  stools.   No facility-administered encounter medications on file as of 01/14/2024.    Allergies (verified) Codeine   History: Past Medical History:  Diagnosis Date   Arthritis    CAD (coronary artery disease)    Cardiomyopathy    Cardiomyopathy    Hepatitis    in 2nd grade   History of kidney stones    Hyperlipidemia    Hypertension    Hypothyroid    Past Surgical History:  Procedure Laterality Date   BREAST REDUCTION SURGERY  04/14/2020   CARDIAC CATHETERIZATION     TONSILLECTOMY      TOTAL KNEE ARTHROPLASTY Left 05/08/2023   Procedure: TOTAL KNEE ARTHROPLASTY;  Surgeon: Durene Romans, MD;  Location: [facility];  Service: Orthopedics;  Laterality: Left;   TOTAL KNEE ARTHROPLASTY Left 05/08/2023   tummy tuck  11/23/2019   Family History  Problem Relation Age of Onset   Hypertension Mother    Heart disease Father        CHF   Social History   Socioeconomic History   Marital status: Married    Spouse name: Greggory Stallion   Number of children: 1   Years of education: Not on file   Highest education level: Bachelor's degree (e.g., BA, AB, BS)  Occupational History   Occupation: ReTIRED    Associate Professor: Technical sales engineer [employer]    Comment: Associate Professor of [place]  Tobacco Use   Smoking status: Former    Current packs/day: 0.00    Average packs/day: 0.3 packs/day for 5.0 years (1.5 ttl pk-yrs)    Types: Cigarettes    Start date: 02/20/1972    Quit date: 02/19/1977    Years since quitting: 46.9   Smokeless tobacco: Never  Vaping Use   Vaping status: Never Used  Substance and Sexual Activity   Alcohol use: Yes    Alcohol/week: 2.0 standard drinks of alcohol    Types: 2 Standard drinks or equivalent per week    Comment: Occasional   Drug use: Never   Sexual activity: Not on file  Other Topics Concern   Not on file  Social History Narrative   Lives with her husband and 2 dogs   Social Drivers of Corporate investment banker Strain: Low Risk  (01/14/2024)   Overall Financial Resource Strain (CARDIA)    Difficulty of Paying Living Expenses: Not hard at all  Food Insecurity: No Food Insecurity (01/14/2024)   Hunger Vital Sign    Worried About Running Out of Food in the Last Year: Never true    Ran Out of Food in the Last Year: Never true  Transportation Needs: No Transportation Needs (01/14/2024)   PRAPARE - Administrator, Civil Service (Medical): No    Lack of Transportation (Non-Medical): No  Physical Activity: Inactive (01/14/2024)   Exercise Vital  Sign    Days of Exercise per Week: 0 days    Minutes of Exercise per Session: 0 min  Stress: No Stress Concern Present (01/14/2024)   [employer] of Occupational Health - Occupational Stress Questionnaire    Feeling of Stress : Only a little  Social Connections: Moderately Isolated (01/14/2024)   Social Connection and Isolation Panel [NHANES]    Frequency of Communication with Friends and Family: More than three times a week    Frequency of Social Gatherings with Friends and Family: Three times a week    Attends Religious Services: Never    Active Member of Clubs or Organizations: No  Attends Banker Meetings: Never    Marital Status: Married    Tobacco Counseling Counseling given: Not Answered    Clinical Intake:  Pre-visit preparation completed: Yes  Pain : No/denies pain     BMI - recorded: 30.99 Nutritional Risks: None Diabetes: No  Lab Results  Component Value Date   HGBA1C 4.0 (L) 09/22/2022     How often do you need to have someone help you when you read instructions, pamphlets, or other written materials from your doctor or pharmacy?: 1 - Never  Interpreter Needed?: No  Information entered by :: Jessilyn Catino, RMA   Activities of Daily Living     01/14/2024    9:53 AM 05/08/2023   12:45 PM  In your present state of health, do you have any difficulty performing the following activities:  Hearing? 1 1  Comment wears hearing aides   Vision? 0 0  Difficulty concentrating or making decisions? 0 0  Walking or climbing stairs? 0 0  Dressing or bathing? 0 0  Doing errands, shopping? 0 0  Preparing Food and eating ? N   Using the Toilet? N   In the past six months, have you accidently leaked urine? N   Do you have problems with loss of bowel control? N   Managing your Medications? N   Managing your Finances? N   Housekeeping or managing your Housekeeping? N     Patient Care Team: Myrlene Broker, MD as PCP - General (Internal  Medicine) Runell Gess, MD as Consulting Physician (Cardiology) Shea Evans, [facility])  Indicate any recent Medical Services you may have received from other than [facility] providers in the past year (date may be approximate).     Assessment:   This is a routine wellness examination for [place].  Hearing/Vision screen Hearing Screening - Comments:: wears hearing aides Vision Screening - Comments:: Wears eyeglasses   Goals Addressed               This Visit's Progress     Patient Stated (pt-stated)        To stay healthy.         Depression Screen     01/14/2024   10:35 AM 01/22/2023    3:09 PM 05/16/2021    3:43 PM  PHQ 2/9 Scores  PHQ - 2 Score 0 0 0  PHQ- 9 Score 0 1     Fall Risk     01/14/2024   10:29 AM 01/22/2023    3:09 PM 09/22/2022   12:24 PM  Fall Risk   Falls in the past year? 0 0 0  Number falls in past yr: 0 0 0  Injury with Fall? 0 0 0  Risk for fall due to : No Fall Risks  No Fall Risks  Follow up Falls prevention discussed;Falls evaluation completed Falls evaluation completed Falls evaluation completed    MEDICARE RISK AT HOME:  Medicare Risk at Home Any stairs in or around the home?: [place] If so, are there any without handrails?: Yes Home free of loose throw rugs in walkways, pet beds, electrical cords, etc?: Yes Adequate lighting in your home to reduce risk of falls?: Yes Life alert?: Yes (Apple watch) Use of a cane, walker or w/c?: No Grab bars in the bathroom?: No Shower chair or bench in shower?: Yes Elevated toilet seat or a handicapped toilet?: Yes  TIMED UP AND GO:  Was the test performed?  No  Cognitive Function: 6CIT completed        01/14/2024   10:31 AM  6CIT Screen  What Year? 0 points  What month? 0 points  What time? 0 points  Count back from 20 0 points  Months in reverse 0 points  Repeat phrase 0 points  Total Score 0 points    Immunizations Immunization History  Administered  Date(s) Administered   Influenza-Unspecified 07/18/2018   PFIZER(Purple Top)SARS-COV-2 Vaccination 12/25/2019, 01/15/2020   Tdap 10/09/2009, 09/22/2022   Zoster Recombinant(Shingrix) 05/20/2021, 08/15/2021   Zoster, Live 07/10/2015    Screening Tests Health Maintenance  Topic Date Due   Pneumonia Vaccine 60+ Years old (1 of 2 - PCV) Never done   Hepatitis C Screening  Never done   DEXA SCAN  Never done   COVID-19 Vaccine (3 - 2024-25 season) 06/10/2023   INFLUENZA VACCINE  05/09/2024   MAMMOGRAM  07/03/2024   Medicare Annual Wellness (AWV)  01/13/2025   Colonoscopy  08/17/2025   DTaP/Tdap/Td (3 - Td or Tdap) 09/22/2032   Zoster Vaccines- Shingrix  Completed   HPV VACCINES  Aged Out    Health Maintenance  Health Maintenance Due  Topic Date Due   Pneumonia Vaccine 61+ Years old (1 of 2 - PCV) Never done   Hepatitis C Screening  Never done   DEXA SCAN  Never done   COVID-19 Vaccine (3 - 2024-25 season) 06/10/2023   Health Maintenance Items Addressed: See Nurse Notes  Additional Screening:  Vision Screening: Recommended annual ophthalmology exams for early detection of glaucoma and other disorders of the eye.  Dental Screening: Recommended annual dental exams for proper oral hygiene  Community Resource Referral / Chronic Care Management: CRR required this visit?  No   CCM required this visit?  No     Plan:     I have personally reviewed and noted the following in the patient's chart:   Medical and social history Use of alcohol, tobacco or illicit drugs  Current medications and supplements including opioid prescriptions. Patient is not currently taking opioid prescriptions. Functional ability and status Nutritional status Physical activity Advanced directives List of other physicians Hospitalizations, surgeries, and ER visits in previous 12 months Vitals Screenings to include cognitive, depression, and falls Referrals and appointments  In addition, I have  reviewed and discussed with patient certain preventive protocols, quality metrics, and best practice recommendations. A written personalized care plan for preventive services as well as general preventive health recommendations were provided to patient.     Tekia [place] L Kiarrah Rausch, CMA   01/14/2024   After Visit Summary: (MyChart) Due to this being a telephonic visit, the after visit summary with patients personalized plan was offered to patient via MyChart   Notes: Please refer to Routing Comments.

## 2024-01-14 NOTE — Patient Instructions (Signed)
 [name] , Thank you for taking time to come for your Medicare Wellness Visit. I appreciate your ongoing commitment to your health goals. Please review the following plan we discussed and let me know if I can assist you in the future.   Referrals/Orders/Follow-Ups/Clinician Recommendations: It was nice talking with you today.  Aim for 30 minutes of exercise or brisk walking, 6-8 glasses of water, and 5 servings of fruits and vegetables each day.   This is a list of the screening recommended for you and due dates:  Health Maintenance  Topic Date Due   Pneumonia Vaccine (1 of 2 - PCV) Never done   Hepatitis C Screening  Never done   DEXA scan (bone density measurement)  Never done   COVID-19 Vaccine (3 - 2024-25 season) 06/10/2023   Flu Shot  05/09/2024   Mammogram  07/03/2024   Medicare Annual Wellness Visit  01/13/2025   Colon Cancer Screening  08/17/2025   DTaP/Tdap/Td vaccine (3 - Td or Tdap) 09/22/2032   Zoster (Shingles) Vaccine  Completed   HPV Vaccine  Aged Out    Advanced directives: (Copy Requested) Please bring a copy of your health care power of attorney and living will to the office to be added to your chart at your convenience. You can mail to [facility] [address] [place], [place] [address] or email to [email or web address]  Next Medicare Annual Wellness Visit scheduled for next year: Yes

## 2024-02-07 DIAGNOSIS — H5213 Myopia, bilateral: Secondary | ICD-10-CM | POA: Diagnosis not present

## 2024-02-28 ENCOUNTER — Encounter: Payer: Self-pay | Admitting: Cardiovascular Disease

## 2024-02-29 ENCOUNTER — Other Ambulatory Visit: Payer: Self-pay

## 2024-02-29 ENCOUNTER — Encounter: Payer: Self-pay | Admitting: Internal Medicine

## 2024-02-29 DIAGNOSIS — I10 Essential (primary) hypertension: Secondary | ICD-10-CM

## 2024-02-29 MED ORDER — ROSUVASTATIN CALCIUM 20 MG PO TABS: 20.0000 mg | ORAL_TABLET | Freq: Every day | ORAL | 0 refills | Status: DC

## 2024-02-29 MED ORDER — CARVEDILOL 3.125 MG PO TABS: 3.1250 mg | ORAL_TABLET | Freq: Two times a day (BID) | ORAL | 1 refills | Status: DC

## 2024-03-04 DIAGNOSIS — K080 Exfoliation of teeth due to systemic causes: Secondary | ICD-10-CM | POA: Diagnosis not present

## 2024-03-11 ENCOUNTER — Other Ambulatory Visit: Payer: Self-pay | Admitting: Internal Medicine

## 2024-03-11 DIAGNOSIS — I10 Essential (primary) hypertension: Secondary | ICD-10-CM

## 2024-03-21 ENCOUNTER — Other Ambulatory Visit: Payer: Self-pay | Admitting: Internal Medicine

## 2024-03-27 ENCOUNTER — Other Ambulatory Visit: Payer: Self-pay | Admitting: Internal Medicine

## 2024-03-27 ENCOUNTER — Other Ambulatory Visit: Payer: Self-pay

## 2024-03-27 DIAGNOSIS — I10 Essential (primary) hypertension: Secondary | ICD-10-CM

## 2024-03-27 MED ORDER — CARVEDILOL 3.125 MG PO TABS: 3.1250 mg | ORAL_TABLET | Freq: Two times a day (BID) | ORAL | 0 refills | Status: AC

## 2024-03-27 MED ORDER — ROSUVASTATIN CALCIUM 20 MG PO TABS
20.0000 mg | ORAL_TABLET | Freq: Every day | ORAL | 0 refills | Status: DC
Start: 2024-03-27 — End: 2024-08-20

## 2024-04-14 ENCOUNTER — Encounter: Admitting: Internal Medicine

## 2024-05-07 DIAGNOSIS — Z96652 Presence of left artificial knee joint: Secondary | ICD-10-CM | POA: Diagnosis not present

## 2024-05-07 DIAGNOSIS — M1711 Unilateral primary osteoarthritis, right knee: Secondary | ICD-10-CM | POA: Diagnosis not present

## 2024-05-31 ENCOUNTER — Other Ambulatory Visit: Payer: Self-pay | Admitting: Internal Medicine

## 2024-06-10 ENCOUNTER — Encounter: Admitting: Internal Medicine

## 2024-08-20 ENCOUNTER — Other Ambulatory Visit: Payer: Self-pay | Admitting: Internal Medicine

## 2024-08-30 ENCOUNTER — Other Ambulatory Visit: Payer: Self-pay | Admitting: Internal Medicine

## 2024-09-16 ENCOUNTER — Ambulatory Visit: Admitting: Internal Medicine

## 2024-09-16 ENCOUNTER — Encounter: Payer: Self-pay | Admitting: Internal Medicine

## 2024-09-16 VITALS — BP 138/80 | HR 74 | Temp 98.0°F | Ht 66.0 in | Wt 194.0 lb

## 2024-09-16 DIAGNOSIS — E039 Hypothyroidism, unspecified: Secondary | ICD-10-CM

## 2024-09-16 DIAGNOSIS — Z23 Encounter for immunization: Secondary | ICD-10-CM

## 2024-09-16 DIAGNOSIS — E782 Mixed hyperlipidemia: Secondary | ICD-10-CM

## 2024-09-16 NOTE — Patient Instructions (Signed)
 Get the RSV at the pharmacy

## 2024-09-16 NOTE — Progress Notes (Unsigned)
   Subjective:   Patient ID: Erin Mccann, female    DOB: 10/09/1956, 68 y.o.   MRN: [id number]  The patient is here for physical. Pertinent topics discussed: Discussed the use of AI scribe software for clinical note transcription with the patient, who gave verbal consent to proceed.  History of Present Illness Erin Mccann is a 68 year old female who presents for a follow-up visit and vaccination update.  She is recovering from a recent cold characterized by laryngitis and morning phlegm. She manages symptoms with hot tea and water , which helps loosen the phlegm. A heart-safe expectorant was tried without significant relief. No postnasal drip at night is noted, and she feels she is improving.  No new chest pain, tightness, or pressure. Her last echocardiogram was about a year ago before her knee replacement surgery, with no issues reported since. Her knee replacement surgery was successful, and she has no current knee pain.  No breathing problems, stomach issues, diarrhea, constipation, heartburn, or blood in stool. Her weight has remained stable since her last visit.  She mentions a new skin spot present for about a week, described as possibly a small cyst or blister-like formation. It is sore, and she is monitoring it for changes.  She received her flu shot in early October and plans to get the pneumonia vaccine during this visit. She has not received the COVID-19 vaccine. She received her shingles vaccine in 2022 and is considering the RSV vaccine.  PMH, [facility], social history reviewed and updated  Review of Systems  Objective:  Physical Exam  Vitals:   09/16/24 1624  BP: 138/80  Pulse: 74  Temp: 98 F (36.7 C)  TempSrc: Oral  SpO2: 97%  Weight: 194 lb (88 kg)  Height: 5' 6 (1.676 m)    Assessment & Plan:  Prevnar 20 given at visit

## 2024-09-17 DIAGNOSIS — Z1231 Encounter for screening mammogram for malignant neoplasm of breast: Secondary | ICD-10-CM | POA: Diagnosis not present

## 2024-09-17 DIAGNOSIS — L920 Granuloma annulare: Secondary | ICD-10-CM | POA: Diagnosis not present

## 2024-09-17 DIAGNOSIS — Z01419 Encounter for gynecological examination (general) (routine) without abnormal findings: Secondary | ICD-10-CM | POA: Diagnosis not present

## 2024-09-17 DIAGNOSIS — L570 Actinic keratosis: Secondary | ICD-10-CM | POA: Diagnosis not present

## 2024-09-17 DIAGNOSIS — D225 Melanocytic nevi of trunk: Secondary | ICD-10-CM | POA: Diagnosis not present

## 2024-09-17 DIAGNOSIS — L578 Other skin changes due to chronic exposure to nonionizing radiation: Secondary | ICD-10-CM | POA: Diagnosis not present

## 2024-09-17 DIAGNOSIS — K080 Exfoliation of teeth due to systemic causes: Secondary | ICD-10-CM | POA: Diagnosis not present

## 2024-09-17 DIAGNOSIS — L719 Rosacea, unspecified: Secondary | ICD-10-CM | POA: Diagnosis not present

## 2024-09-17 DIAGNOSIS — Z6833 Body mass index (BMI) 33.0-33.9, adult: Secondary | ICD-10-CM | POA: Diagnosis not present

## 2024-09-18 ENCOUNTER — Other Ambulatory Visit (INDEPENDENT_AMBULATORY_CARE_PROVIDER_SITE_OTHER)

## 2024-09-18 DIAGNOSIS — E782 Mixed hyperlipidemia: Secondary | ICD-10-CM | POA: Diagnosis not present

## 2024-09-18 DIAGNOSIS — E039 Hypothyroidism, unspecified: Secondary | ICD-10-CM

## 2024-09-18 LAB — LIPID PANEL
Cholesterol: 130 mg/dL (ref 0–200)
HDL: 52.5 mg/dL (ref >39.00)
LDL Cholesterol: 47 mg/dL (ref 0–99)
NonHDL: 77.25
Total CHOL/HDL Ratio: 2
Triglycerides: 149 mg/dL (ref 0.0–149.0)
VLDL: 29.8 mg/dL (ref 0.0–40.0)

## 2024-09-18 LAB — COMPREHENSIVE METABOLIC PANEL WITH GFR
ALT: 14 U/L (ref 0–35)
AST: 17 U/L (ref 0–37)
Albumin: 4.3 g/dL (ref 3.5–5.2)
Alkaline Phosphatase: 73 U/L (ref 39–117)
BUN: 18 mg/dL (ref 6–23)
CO2: 31 meq/L (ref 19–32)
Calcium: 10.5 mg/dL (ref 8.4–10.5)
Chloride: 103 meq/L (ref 96–112)
Creatinine, Ser: 0.87 mg/dL (ref 0.40–1.20)
GFR: 69.3 mL/min (ref >60.00)
Glucose, Bld: 97 mg/dL (ref 70–99)
Potassium: 4.1 meq/L (ref 3.5–5.1)
Sodium: 139 meq/L (ref 135–145)
Total Bilirubin: 0.6 mg/dL (ref 0.2–1.2)
Total Protein: 6.9 g/dL (ref 6.0–8.3)

## 2024-09-18 LAB — CBC
HCT: 34.2 % — ABNORMAL LOW (ref 36.0–46.0)
Hemoglobin: 11.2 g/dL — ABNORMAL LOW (ref 12.0–15.0)
MCHC: 32.8 g/dL (ref 30.0–36.0)
MCV: 90.7 fl (ref 78.0–100.0)
Platelets: 163 K/uL (ref 150.0–400.0)
RBC: 3.77 Mil/uL — ABNORMAL LOW (ref 3.87–5.11)
RDW: 15.8 % — ABNORMAL HIGH (ref 11.5–15.5)
WBC: 4.8 K/uL (ref 4.0–10.5)

## 2024-09-18 LAB — TSH: TSH: 5.83 u[IU]/mL — ABNORMAL HIGH (ref 0.35–5.50)

## 2024-09-18 NOTE — Assessment & Plan Note (Signed)
 Flu shot yearly. Pneumonia 20 given. Shingrix complete. Tetanus up to date. Colonoscopy up to ate. Mammogram up to date, pap smear up to date and dexa up to date. Counseled about sun safety and mole surveillance. Counseled about the dangers of distracted driving. Given 10 year screening recommendations.

## 2024-09-18 NOTE — Assessment & Plan Note (Signed)
 Checking lipid panel and adjust crestor as needed.

## 2024-09-18 NOTE — Assessment & Plan Note (Signed)
Checking TSH and adjust levothyroxine as needed.

## 2024-09-18 NOTE — Assessment & Plan Note (Signed)
 Using alprazolam  rarely and can refill as needed. This is not daily and not significant enough to take daily medicine.

## 2024-09-18 NOTE — Assessment & Plan Note (Signed)
 Seeing cardiology and stable overall on recent echo. No flare today.

## 2024-09-18 NOTE — Assessment & Plan Note (Signed)
 BP at goal on regimen checking CMP and adjust as needed.

## 2024-09-19 ENCOUNTER — Other Ambulatory Visit: Payer: Self-pay | Admitting: Internal Medicine

## 2024-09-19 DIAGNOSIS — E039 Hypothyroidism, unspecified: Secondary | ICD-10-CM

## 2024-09-22 ENCOUNTER — Ambulatory Visit: Payer: Self-pay | Admitting: Internal Medicine

## 2025-01-14 ENCOUNTER — Ambulatory Visit

## 2025-02-06 ENCOUNTER — Ambulatory Visit
